# Patient Record
Sex: Female | Born: 1965 | Race: Black or African American | Hispanic: No | Marital: Single | Smoking: Current every day smoker
Health system: Southern US, Community
[De-identification: ages and names within clinical notes are randomized; demographics above are authoritative.]

## PROBLEM LIST (undated history)

## (undated) DIAGNOSIS — F329 Major depressive disorder, single episode, unspecified: Secondary | ICD-10-CM

## (undated) DIAGNOSIS — D649 Anemia, unspecified: Secondary | ICD-10-CM

## (undated) DIAGNOSIS — F32A Depression, unspecified: Secondary | ICD-10-CM

## (undated) DIAGNOSIS — I10 Essential (primary) hypertension: Secondary | ICD-10-CM

## (undated) DIAGNOSIS — F419 Anxiety disorder, unspecified: Secondary | ICD-10-CM

## (undated) HISTORY — DX: Essential (primary) hypertension: I10

## (undated) HISTORY — PX: OTHER SURGICAL HISTORY: SHX169

## (undated) HISTORY — PX: TUBAL LIGATION: SHX77

---

## 2010-05-17 ENCOUNTER — Ambulatory Visit (HOSPITAL_COMMUNITY)
Admission: RE | Admit: 2010-05-17 | Discharge: 2010-05-17 | Payer: Self-pay | Source: Home / Self Care | Admitting: Family Medicine

## 2010-08-30 ENCOUNTER — Emergency Department (HOSPITAL_COMMUNITY)
Admission: EM | Admit: 2010-08-30 | Discharge: 2010-08-30 | Disposition: A | Discharge status: Home / Self Care | Payer: Self-pay | Attending: Emergency Medicine | Admitting: Emergency Medicine

## 2010-08-30 DIAGNOSIS — N390 Urinary tract infection, site not specified: Secondary | ICD-10-CM | POA: Insufficient documentation

## 2010-08-30 DIAGNOSIS — R109 Unspecified abdominal pain: Secondary | ICD-10-CM | POA: Insufficient documentation

## 2010-08-30 DIAGNOSIS — N898 Other specified noninflammatory disorders of vagina: Secondary | ICD-10-CM | POA: Insufficient documentation

## 2010-08-30 LAB — WET PREP, GENITAL: Trich, Wet Prep: NONE SEEN

## 2010-08-30 LAB — URINALYSIS, ROUTINE W REFLEX MICROSCOPIC
Ketones, ur: NEGATIVE mg/dL
Nitrite: NEGATIVE
Specific Gravity, Urine: >1.03 — ABNORMAL HIGH (ref 1.005–1.030)
Urobilinogen, UA: 0.2 mg/dL (ref 0.0–1.0)

## 2010-08-30 LAB — POCT PREGNANCY, URINE: Preg Test, Ur: NEGATIVE

## 2010-09-01 LAB — GC/CHLAMYDIA PROBE AMP, GENITAL
Chlamydia, DNA Probe: NEGATIVE
GC Probe Amp, Genital: NEGATIVE

## 2011-02-26 ENCOUNTER — Other Ambulatory Visit: Payer: Self-pay | Admitting: Obstetrics & Gynecology

## 2011-03-01 ENCOUNTER — Encounter (HOSPITAL_COMMUNITY): Admission: RE | Admit: 2011-03-01 | Payer: Self-pay | Source: Ambulatory Visit

## 2011-03-01 ENCOUNTER — Encounter (HOSPITAL_COMMUNITY): Payer: Self-pay

## 2011-03-01 ENCOUNTER — Encounter (HOSPITAL_COMMUNITY)
Admission: RE | Admit: 2011-03-01 | Discharge: 2011-03-01 | Disposition: A | Discharge status: Home / Self Care | Payer: Self-pay | Source: Ambulatory Visit | Attending: Obstetrics & Gynecology | Admitting: Obstetrics & Gynecology

## 2011-03-01 HISTORY — DX: Depression, unspecified: F32.A

## 2011-03-01 HISTORY — DX: Anxiety disorder, unspecified: F41.9

## 2011-03-01 HISTORY — DX: Anemia, unspecified: D64.9

## 2011-03-01 HISTORY — DX: Major depressive disorder, single episode, unspecified: F32.9

## 2011-03-01 LAB — URINALYSIS, ROUTINE W REFLEX MICROSCOPIC
Bilirubin Urine: NEGATIVE
Nitrite: NEGATIVE
Specific Gravity, Urine: >1.03 — ABNORMAL HIGH (ref 1.005–1.030)
pH: 5.5 (ref 5.0–8.0)

## 2011-03-01 LAB — HCG, QUANTITATIVE, PREGNANCY: hCG, Beta Chain, Quant, S: <5 m[IU]/mL (ref <5)

## 2011-03-01 LAB — CBC
MCHC: 33.3 g/dL (ref 30.0–36.0)
Platelets: 393 10*3/uL (ref 150–400)
RDW: 15.4 % (ref 11.5–15.5)

## 2011-03-01 LAB — COMPREHENSIVE METABOLIC PANEL
ALT: 9 U/L (ref 0–35)
Albumin: 4 g/dL (ref 3.5–5.2)
Alkaline Phosphatase: 98 U/L (ref 39–117)
BUN: 7 mg/dL (ref 6–23)
Potassium: 4.2 mEq/L (ref 3.5–5.1)
Sodium: 139 mEq/L (ref 135–145)
Total Protein: 8 g/dL (ref 6.0–8.3)

## 2011-03-01 LAB — SURGICAL PCR SCREEN: Staphylococcus aureus: NEGATIVE

## 2011-03-01 NOTE — Patient Instructions (Addendum)
19 Tracy Freeman  03/01/2011   Your procedure is scheduled on: 03-07-2011  Report to [facility] at  615  AM.  Call this number if you have problems the morning of surgery: [phone]   Remember:   Do not eat food:After Midnight.  Do not drink clear liquids: After Midnight.  Take these medicines the morning of surgery with A SIP OF WATER: none   Do not wear jewelry, make-up or nail polish.  Do not wear lotions, powders, or perfumes. You may wear deodorant.  Do not shave 48 hours prior to surgery.  Do not bring valuables to the hospital.  Contacts, dentures or bridgework may not be worn into surgery.  Leave suitcase in the car. After surgery it may be brought to your room.  For patients admitted to the hospital, checkout time is 11:00 AM the day of discharge.   Patients discharged the day of surgery will not be allowed to drive home.  Name and phone number of your driver: family  Special Instructions: CHG Shower Use Special Wash: 1/2 bottle night before surgery and 1/2 bottle morning of surgery.   Please read over the following fact sheets that you were given: Pain Booklet, Blood Transfusion Information, MRSA Information, Surgical Site Infection Prevention, Anesthesia Post-op Instructions and Care and Recovery After Surgery PATIENT INSTRUCTIONS POST-ANESTHESIA  IMMEDIATELY FOLLOWING SURGERY:  Do not drive or operate machinery for the first twenty four hours after surgery.  Do not make any important decisions for twenty four hours after surgery or while taking narcotic pain medications or sedatives.  If you develop intractable nausea and vomiting or a severe headache please notify your doctor immediately.  FOLLOW-UP:  Please make an appointment with your surgeon as instructed. You do not need to follow up with anesthesia unless specifically instructed to do so.  WOUND CARE INSTRUCTIONS (if applicable):  Keep a dry clean dressing on the anesthesia/puncture wound site if there is drainage.   Once the wound has quit draining you may leave it open to air.  Generally you should leave the bandage intact for twenty four hours unless there is drainage.  If the epidural site drains for more than 36-48 hours please call the anesthesia department.  QUESTIONS?:  Please feel free to call your physician or the hospital operator if you have any questions, and they will be happy to assist you.     [facility] Anesthesia Department [address] [place] [phone]

## 2011-03-02 ENCOUNTER — Other Ambulatory Visit (HOSPITAL_COMMUNITY): Payer: Self-pay

## 2011-03-06 NOTE — Pre-Procedure Instructions (Signed)
Patient is a Probation officer , signed refusal of blood consent

## 2011-03-07 ENCOUNTER — Ambulatory Visit (HOSPITAL_COMMUNITY)
Admission: RE | Admit: 2011-03-07 | Discharge: 2011-03-09 | Disposition: A | Discharge status: Home / Self Care | Payer: Self-pay | Source: Ambulatory Visit | Attending: Obstetrics & Gynecology | Admitting: Obstetrics & Gynecology

## 2011-03-07 ENCOUNTER — Other Ambulatory Visit: Payer: Self-pay | Admitting: Obstetrics & Gynecology

## 2011-03-07 ENCOUNTER — Inpatient Hospital Stay (HOSPITAL_COMMUNITY): Payer: Self-pay | Admitting: Anesthesiology

## 2011-03-07 ENCOUNTER — Encounter (HOSPITAL_COMMUNITY): Payer: Self-pay | Admitting: Anesthesiology

## 2011-03-07 ENCOUNTER — Encounter (HOSPITAL_COMMUNITY): Payer: Self-pay

## 2011-03-07 ENCOUNTER — Encounter (HOSPITAL_COMMUNITY)
Admission: RE | Disposition: A | Discharge status: Home / Self Care | Payer: Self-pay | Source: Ambulatory Visit | Attending: Obstetrics & Gynecology

## 2011-03-07 DIAGNOSIS — E119 Type 2 diabetes mellitus without complications: Secondary | ICD-10-CM | POA: Insufficient documentation

## 2011-03-07 DIAGNOSIS — N920 Excessive and frequent menstruation with regular cycle: Secondary | ICD-10-CM | POA: Insufficient documentation

## 2011-03-07 DIAGNOSIS — Z90711 Acquired absence of uterus with remaining cervical stump: Secondary | ICD-10-CM

## 2011-03-07 DIAGNOSIS — Z01812 Encounter for preprocedural laboratory examination: Secondary | ICD-10-CM | POA: Insufficient documentation

## 2011-03-07 DIAGNOSIS — D649 Anemia, unspecified: Secondary | ICD-10-CM | POA: Insufficient documentation

## 2011-03-07 DIAGNOSIS — K432 Incisional hernia without obstruction or gangrene: Secondary | ICD-10-CM | POA: Insufficient documentation

## 2011-03-07 DIAGNOSIS — D259 Leiomyoma of uterus, unspecified: Principal | ICD-10-CM | POA: Insufficient documentation

## 2011-03-07 DIAGNOSIS — N946 Dysmenorrhea, unspecified: Secondary | ICD-10-CM | POA: Insufficient documentation

## 2011-03-07 HISTORY — PX: INCISIONAL HERNIA REPAIR: SHX193

## 2011-03-07 HISTORY — PX: SUPRACERVICAL ABDOMINAL HYSTERECTOMY: SHX5393

## 2011-03-07 SURGERY — HYSTERECTOMY, SUPRACERVICAL, ABDOMINAL
Anesthesia: General | Site: Abdomen | Wound class: Clean

## 2011-03-07 MED ORDER — NEOSTIGMINE METHYLSULFATE 1 MG/ML IJ SOLN
INTRAMUSCULAR | Status: AC
  Filled 2011-03-07: qty 10

## 2011-03-07 MED ORDER — SIMETHICONE 80 MG PO CHEW: 80.0000 mg | CHEWABLE_TABLET | Freq: Four times a day (QID) | ORAL | Status: DC | PRN

## 2011-03-07 MED ORDER — CEFAZOLIN SODIUM 1-5 GM-% IV SOLN
INTRAVENOUS | Status: DC | PRN
  Administered 2011-03-07: 1 g via INTRAVENOUS

## 2011-03-07 MED ORDER — LIDOCAINE HCL (PF) 1 % IJ SOLN
INTRAMUSCULAR | Status: AC
  Filled 2011-03-07: qty 5

## 2011-03-07 MED ORDER — CEFAZOLIN SODIUM 1-5 GM-% IV SOLN: 1.0000 g | INTRAVENOUS | Status: DC

## 2011-03-07 MED ORDER — ONDANSETRON HCL 4 MG/2ML IJ SOLN
INTRAMUSCULAR | Status: DC | PRN
  Administered 2011-03-07: 4 mg via INTRAVENOUS

## 2011-03-07 MED ORDER — FENTANYL CITRATE 0.05 MG/ML IJ SOLN
INTRAMUSCULAR | Status: AC
  Administered 2011-03-07: 50 ug via INTRAVENOUS
  Filled 2011-03-07: qty 2

## 2011-03-07 MED ORDER — LACTATED RINGERS IV SOLN: INTRAVENOUS | Status: DC

## 2011-03-07 MED ORDER — FENTANYL CITRATE 0.05 MG/ML IJ SOLN
INTRAMUSCULAR | Status: AC
  Administered 2011-03-07: 50 ug via INTRAVENOUS
  Filled 2011-03-07: qty 5

## 2011-03-07 MED ORDER — LACTATED RINGERS IV SOLN
INTRAVENOUS | Status: DC | PRN
  Administered 2011-03-07 (×2): via INTRAVENOUS

## 2011-03-07 MED ORDER — MIDAZOLAM HCL 2 MG/2ML IJ SOLN
INTRAMUSCULAR | Status: AC
  Filled 2011-03-07: qty 2

## 2011-03-07 MED ORDER — IBUPROFEN 800 MG PO TABS
800.0000 mg | ORAL_TABLET | Freq: Three times a day (TID) | ORAL | Status: DC | PRN
  Administered 2011-03-09: 800 mg via ORAL
  Filled 2011-03-07: qty 1

## 2011-03-07 MED ORDER — ONDANSETRON HCL 4 MG/2ML IJ SOLN: 4.0000 mg | Freq: Once | INTRAMUSCULAR | Status: DC | PRN

## 2011-03-07 MED ORDER — THROMBIN 5000 UNITS EX KIT: PACK | CUTANEOUS | Status: DC | PRN

## 2011-03-07 MED ORDER — DOCUSATE SODIUM 100 MG PO CAPS
100.0000 mg | ORAL_CAPSULE | Freq: Every day | ORAL | Status: DC
  Administered 2011-03-08: 100 mg via ORAL
  Filled 2011-03-07: qty 1

## 2011-03-07 MED ORDER — ROCURONIUM BROMIDE 100 MG/10ML IV SOLN
INTRAVENOUS | Status: DC | PRN
  Administered 2011-03-07: 10 mg via INTRAVENOUS
  Administered 2011-03-07: 30 mg via INTRAVENOUS
  Administered 2011-03-07: 5 mg via INTRAVENOUS
  Administered 2011-03-07: 15 mg via INTRAVENOUS
  Administered 2011-03-07: 10 mg via INTRAVENOUS

## 2011-03-07 MED ORDER — FENTANYL CITRATE 0.05 MG/ML IJ SOLN
INTRAMUSCULAR | Status: DC | PRN
  Administered 2011-03-07 (×8): 50 ug via INTRAVENOUS

## 2011-03-07 MED ORDER — HYDROMORPHONE HCL 1 MG/ML IJ SOLN
1.0000 mg | INTRAMUSCULAR | Status: DC | PRN
  Administered 2011-03-07 – 2011-03-09 (×7): 1 mg via INTRAVENOUS
  Filled 2011-03-07 (×8): qty 1

## 2011-03-07 MED ORDER — CEFAZOLIN SODIUM 1-5 GM-% IV SOLN
INTRAVENOUS | Status: AC
  Filled 2011-03-07: qty 50

## 2011-03-07 MED ORDER — LACTATED RINGERS IV SOLN
INTRAVENOUS | Status: DC
  Administered 2011-03-07: 500 mL via INTRAVENOUS
  Administered 2011-03-07: 07:00:00 via INTRAVENOUS

## 2011-03-07 MED ORDER — ONDANSETRON HCL 4 MG/2ML IJ SOLN
4.0000 mg | Freq: Four times a day (QID) | INTRAMUSCULAR | Status: DC | PRN
  Filled 2011-03-07: qty 2

## 2011-03-07 MED ORDER — ONDANSETRON HCL 4 MG PO TABS
4.0000 mg | ORAL_TABLET | Freq: Four times a day (QID) | ORAL | Status: DC | PRN
  Administered 2011-03-07: 4 mg via ORAL
  Filled 2011-03-07: qty 1

## 2011-03-07 MED ORDER — ONDANSETRON HCL 4 MG/2ML IJ SOLN
INTRAMUSCULAR | Status: AC
  Administered 2011-03-07: 4 mg
  Filled 2011-03-07: qty 2

## 2011-03-07 MED ORDER — GLYCOPYRROLATE 0.2 MG/ML IJ SOLN
INTRAMUSCULAR | Status: AC
  Filled 2011-03-07: qty 2

## 2011-03-07 MED ORDER — KCL IN DEXTROSE-NACL 20-5-0.45 MEQ/L-%-% IV SOLN
INTRAVENOUS | Status: DC
  Administered 2011-03-07 (×2): via INTRAVENOUS
  Administered 2011-03-08: 1000 mL via INTRAVENOUS
  Administered 2011-03-08: 09:00:00 via INTRAVENOUS

## 2011-03-07 MED ORDER — ROCURONIUM BROMIDE 50 MG/5ML IV SOLN
INTRAVENOUS | Status: AC
  Filled 2011-03-07: qty 1

## 2011-03-07 MED ORDER — HYDROCODONE-ACETAMINOPHEN 5-325 MG PO TABS
1.0000 | ORAL_TABLET | ORAL | Status: DC | PRN
  Administered 2011-03-07: 2 via ORAL
  Administered 2011-03-08: 1 via ORAL
  Filled 2011-03-07: qty 2
  Filled 2011-03-07: qty 1

## 2011-03-07 MED ORDER — LIDOCAINE HCL 1 % IJ SOLN
INTRAMUSCULAR | Status: DC | PRN
  Administered 2011-03-07: 30 mg via INTRADERMAL

## 2011-03-07 MED ORDER — NEOSTIGMINE METHYLSULFATE 1 MG/ML IJ SOLN
INTRAMUSCULAR | Status: DC | PRN
  Administered 2011-03-07: 3 mg via INTRAMUSCULAR

## 2011-03-07 MED ORDER — PROPOFOL 10 MG/ML IV EMUL
INTRAVENOUS | Status: DC | PRN
  Administered 2011-03-07: 120 mg via INTRAVENOUS

## 2011-03-07 MED ORDER — FENTANYL CITRATE 0.05 MG/ML IJ SOLN
25.0000 ug | INTRAMUSCULAR | Status: DC | PRN
  Administered 2011-03-07 (×4): 50 ug via INTRAVENOUS

## 2011-03-07 MED ORDER — METOPROLOL TARTRATE 1 MG/ML IV SOLN
INTRAVENOUS | Status: DC | PRN
  Administered 2011-03-07: 1 mg via INTRAVENOUS

## 2011-03-07 MED ORDER — MIDAZOLAM HCL 2 MG/2ML IJ SOLN
1.0000 mg | INTRAMUSCULAR | Status: DC | PRN
  Administered 2011-03-07: 2 mg via INTRAVENOUS

## 2011-03-07 MED ORDER — THROMBIN 5000 UNITS EX SOLR
CUTANEOUS | Status: AC
  Filled 2011-03-07: qty 5000

## 2011-03-07 MED ORDER — HEMOSTATIC AGENTS (NO CHARGE) OPTIME
TOPICAL | Status: DC | PRN
  Administered 2011-03-07: 1 via TOPICAL

## 2011-03-07 MED ORDER — GLYCOPYRROLATE 0.2 MG/ML IJ SOLN
INTRAMUSCULAR | Status: DC | PRN
  Administered 2011-03-07: .6 mg via INTRAVENOUS

## 2011-03-07 MED ORDER — SODIUM CHLORIDE 0.9 % IR SOLN
Status: DC | PRN
  Administered 2011-03-07: 2000 mL

## 2011-03-07 MED ORDER — ZOLPIDEM TARTRATE 5 MG PO TABS: 5.0000 mg | ORAL_TABLET | Freq: Every evening | ORAL | Status: DC | PRN

## 2011-03-07 MED ORDER — PROPOFOL 10 MG/ML IV EMUL
INTRAVENOUS | Status: AC
  Filled 2011-03-07: qty 20

## 2011-03-07 SURGICAL SUPPLY — 60 items
ADH SKN CLS APL DERMABOND .7 (GAUZE/BANDAGES/DRESSINGS)
APPLIER CLIP 13 LRG OPEN (CLIP) ×2
APR CLP LRG 13 20 CLIP (CLIP) ×1
BAG HAMPER (MISCELLANEOUS) ×2 IMPLANT
BRR ADH 6X5 SEPRAFILM 1 SHT (MISCELLANEOUS)
CELLS DAT CNTRL 66122 CELL SVR (MISCELLANEOUS) ×1 IMPLANT
CLIP APPLIE 13 LRG OPEN (CLIP) IMPLANT
CLOTH BEACON ORANGE TIMEOUT ST (SAFETY) ×2 IMPLANT
COVER LIGHT HANDLE STERIS (MISCELLANEOUS) ×4 IMPLANT
DERMABOND ADVANCED (GAUZE/BANDAGES/DRESSINGS)
DERMABOND ADVANCED .7 DNX12 (GAUZE/BANDAGES/DRESSINGS) ×1 IMPLANT
DRAPE WARM FLUID 44X44 (DRAPE) ×2 IMPLANT
DRESSING TELFA 8X3 (GAUZE/BANDAGES/DRESSINGS) ×1 IMPLANT
ELECT REM PT RETURN 9FT ADLT (ELECTROSURGICAL) ×2
ELECTRODE REM PT RTRN 9FT ADLT (ELECTROSURGICAL) ×1 IMPLANT
FORMALIN 10 PREFIL 480ML (MISCELLANEOUS) ×1 IMPLANT
GLOVE BIO SURGEON STRL SZ7.5 (GLOVE) ×1 IMPLANT
GLOVE BIOGEL PI IND STRL 8 (GLOVE) ×1 IMPLANT
GLOVE BIOGEL PI INDICATOR 8 (GLOVE) ×1
GLOVE ECLIPSE 6.5 STRL STRAW (GLOVE) ×1 IMPLANT
GLOVE ECLIPSE 7.0 STRL STRAW (GLOVE) ×1 IMPLANT
GLOVE ECLIPSE 8.0 STRL XLNG CF (GLOVE) ×2 IMPLANT
GLOVE EXAM NITRILE MD LF STRL (GLOVE) ×1 IMPLANT
GLOVE INDICATOR 7.0 STRL GRN (GLOVE) ×1 IMPLANT
GLOVE INDICATOR 7.5 STRL GRN (GLOVE) ×1 IMPLANT
GOWN BRE IMP SLV AUR XL STRL (GOWN DISPOSABLE) ×6 IMPLANT
GOWN SRG XL XLNG 56XLVL 4 (GOWN DISPOSABLE) ×1 IMPLANT
GOWN STRL NON-REIN XL XLG LVL4 (GOWN DISPOSABLE) ×2
HEMOSTAT SURGICEL 4X8 (HEMOSTASIS) ×1 IMPLANT
INST SET MAJOR GENERAL (KITS) ×2 IMPLANT
KIT ROOM TURNOVER APOR (KITS) ×2 IMPLANT
MANIFOLD NEPTUNE II (INSTRUMENTS) ×2 IMPLANT
MESH HERNIA 3X6 (Mesh General) ×1 IMPLANT
NDL HYPO 18GX1.5 BLUNT FILL (NEEDLE) IMPLANT
NEEDLE HYPO 18GX1.5 BLUNT FILL (NEEDLE) ×2 IMPLANT
NS IRRIG 1000ML POUR BTL (IV SOLUTION) ×4 IMPLANT
PACK ABDOMINAL MAJOR (CUSTOM PROCEDURE TRAY) ×2 IMPLANT
PAD ARMBOARD 7.5X6 YLW CONV (MISCELLANEOUS) ×2 IMPLANT
RETRACTOR WND ALEXIS 18 MED (MISCELLANEOUS) IMPLANT
RETRACTOR WND ALEXIS 25 LRG (MISCELLANEOUS) IMPLANT
RTRCTR WOUND ALEXIS 18CM MED (MISCELLANEOUS) ×2
RTRCTR WOUND ALEXIS 25CM LRG (MISCELLANEOUS)
SEPRAFILM MEMBRANE 5X6 (MISCELLANEOUS) ×1 IMPLANT
SET BASIN LINEN APH (SET/KITS/TRAYS/PACK) ×2 IMPLANT
SPONGE GAUZE 4X4 12PLY (GAUZE/BANDAGES/DRESSINGS) ×1 IMPLANT
STAPLER VISISTAT 35W (STAPLE) ×1 IMPLANT
SUT CHROMIC 0 CT 1 (SUTURE) ×3 IMPLANT
SUT MON AB 3-0 SH 27 (SUTURE) IMPLANT
SUT PDS AB 0 CTX 60 (SUTURE) ×1 IMPLANT
SUT PLAIN 2 0 XLH (SUTURE) ×1 IMPLANT
SUT PROLENE 2 0 CR (SUTURE) ×2 IMPLANT
SUT VIC AB 0 CT1 27 (SUTURE) ×8
SUT VIC AB 0 CT1 27XBRD ANTBC (SUTURE) ×1 IMPLANT
SUT VIC AB 0 CT1 27XCR 8 STRN (SUTURE) ×2 IMPLANT
SUT VIC AB 0 CTX 36 (SUTURE) ×4
SUT VIC AB 0 CTX36XBRD ANTBCTR (SUTURE) ×1 IMPLANT
SUT VICRYL 3 0 (SUTURE) IMPLANT
TAPE CLOTH SURG 4X10 WHT LF (GAUZE/BANDAGES/DRESSINGS) ×1 IMPLANT
TOWEL BLUE STERILE X RAY DET (MISCELLANEOUS) ×1 IMPLANT
TRAY FOLEY CATH 14FR (SET/KITS/TRAYS/PACK) ×2 IMPLANT

## 2011-03-07 NOTE — Anesthesia Preprocedure Evaluation (Addendum)
Anesthesia Evaluation  Name, MR# and DOB Patient awake  General Assessment Comment  Reviewed: Allergy & Precautions, H&P , NPO status , Patient's Chart, lab work & pertinent test results  History of Anesthesia Complications Negative for: history of anesthetic complications  Airway Mallampati: II  Neck ROM: Full  Mouth opening: Limited Mouth Opening  Dental  (+) Poor Dentition, Missing, Loose, Chipped and Dental Advisory Given   Pulmonary  Pneumonia: smokes 1/2 PPD.    pulmonary exam normalPulmonary Exam Normal     Cardiovascular Regular Normal    Neuro/Psych    (+) Anxiety, Depression,    GI/Hepatic/Renal   Endo/Other    Abdominal   Musculoskeletal   Hematology   Peds  Reproductive/Obstetrics    Anesthesia Other Findings            Anesthesia Physical Anesthesia Plan  ASA: II  Anesthesia Plan: General   Post-op Pain Management:    Induction: Intravenous  Airway Management Planned: Oral ETT  Additional Equipment:   Intra-op Plan:   Post-operative Plan: Extubation in OR  Informed Consent: I have reviewed the patients History and Physical, chart, labs and discussed the procedure including the risks, benefits and alternatives for the proposed anesthesia with the patient or authorized representative who has indicated his/her understanding and acceptance.     Plan Discussed with:   Anesthesia Plan Comments:         Anesthesia Quick Evaluation

## 2011-03-07 NOTE — Anesthesia Postprocedure Evaluation (Signed)
  Anesthesia Post-op Note  Patient: Tracy Freeman  Procedure(s) Performed:  HYSTERECTOMY SUPRACERVICAL ABDOMINAL; HERNIA REPAIR INCISIONAL - Incisional Herniorraphy with Mesh  Patient Location: PACU  Anesthesia Type: General  Level of Consciousness: awake, alert  and oriented  Airway and Oxygen Therapy: Patient Spontanous Breathing  Post-op Pain: none  Post-op Assessment: Post-op Vital signs reviewed, Patient's Cardiovascular Status Stable and Respiratory Function Stable  Post-op Vital Signs: stable  Complications: No apparent anesthesia complications

## 2011-03-07 NOTE — Transfer of Care (Signed)
Immediate Anesthesia Transfer of Care Note  Patient: Tracy Freeman  Procedure(s) Performed:  HYSTERECTOMY SUPRACERVICAL ABDOMINAL; HERNIA REPAIR INCISIONAL - Incisional Herniorraphy with Mesh  Patient Location: PACU  Anesthesia Type: General  Level of Consciousness: awake, alert  and oriented  Airway & Oxygen Therapy: Patient Spontanous Breathing and Patient connected to face mask oxygen  Post-op Assessment: Report given to PACU RN  Post vital signs: stable  Complications: No apparent anesthesia complications

## 2011-03-07 NOTE — Op Note (Signed)
Patient:  Tracy Freeman  DOB:  03-21-1966  MRN:  [id number]   Preop Diagnosis:  Incisional hernia  Postop Diagnosis:  Same  Procedure:  Incisional herniorrhaphy with mesh  Surgeon:  Franky Macho, M.D.  Asst.: Dr. Duane Lope, MD  Anes:  Gen. endotracheal  Indications:  Patient is a 33 her old black female who was undergoing a supracervical hysterectomy by Dr. Despina Hidden electively. He requested an intraoperative consultation and as he was having difficulty closing the fascia from previous C-section. Apparently, she did have a significant defect in the fascia when entering the abdominal cavity at the beginning of the procedure. On consultation, I found that there would be too much tension to bring the fascia back transversely without a bridging mesh repair. Thus it was elected to proceed with an incisional herniorrhaphy with mesh.  Procedure note:  Patient was already intubated in the supine position. The Pfannenstiel incision was opened. The peritoneal cavity was already closed. A 3 x 6" polypropylene mesh was then sized and secured in an ovoid fashion to the fascia using 2-0 Prolene interrupted sutures. A tension-free repair was found. The subcutaneous layer was then closed using 2-0 Vicryl interrupted sutures. The skin was closed using staples.  Dr. Despina Hidden will dictate his portion of the procedure.  Complications:  None  EBL:  Minimal  Specimen:  None

## 2011-03-07 NOTE — Anesthesia Procedure Notes (Addendum)
Date/Time: 03/07/2011 8:04 AM Performed by: Glynn Octave Preoxygenation: Pre-oxygenation with 100% oxygen    Performed by: Glynn Octave    Procedure Name: Intubation Date/Time: 03/07/2011 8:09 AM Performed by: Glynn Octave Pre-anesthesia Checklist: Patient identified, Patient being monitored, Timeout performed, Emergency Drugs available and Suction available Patient Re-evaluated:Patient Re-evaluated prior to inductionOxygen Delivery Method: Circle System Utilized Preoxygenation: Pre-oxygenation with 100% oxygen Intubation Type: IV induction Ventilation: Mask ventilation without difficulty Laryngoscope Size: Mac and 3 Grade View: Grade I Tube type: Oral Tube size: 7.0 mm Number of attempts: 1 Airway Equipment and Method: stylet Placement Confirmation: ETT inserted through vocal cords under direct vision,  positive ETCO2 and breath sounds checked- equal and bilateral Secured at: 21 cm Tube secured with: Tape Dental Injury: Teeth and Oropharynx as per pre-operative assessment

## 2011-03-07 NOTE — Op Note (Signed)
Preoperative diagnosis:  1.  Enlarged fibroid uterus, 18 weeks size                                          2.  Menometrorrhagia, resolved on megace                                          3.  Dysmenorrhea and pelvic pain                                         4.  Anemia, resolved on Megace and iron therapy  Postoperative diagnosis:  Same as above + incisional hernia  Procedure:  Abdominal hysterectomy, supracervical by Dr. Despina Hidden                     Repair of incisional hernia using mesh by Dr. Lovell Sheehan  Surgeon:  Lazaro Arms  MD                  Franky Macho, MD   Anesthesia:  General endotracheal  Preoperative clinical summary:  See HPI  Intraoperative findings: preoperative findings confirmed, both ovaries were normal and uterus was probably 20 weeks size.  Patient did have a previously undiagnosed incisional hernia which could not be repaired conventionally because of the resulting tension.  Dr. Lovell Sheehan placed physiomesh and repair.  Description of operation:  Patient was taken to the operating room and placed in the supine position where she underwent general endotracheal anesthesia.  She was then prepped and draped in the usual sterile fashion and a Foley catheter was placed for continuous bladder drainage.  A Pfannenstiel skin incision was made and carried down sharply to the rectus fascia which was scored in the midline and extended laterally.  The fascia was widely seperated I would suppose as a result of her previous caesarean sections.  The fascia was then taken off the muscles superiorly and inferiorly without difficulty.  The muscles were divided.  The peritoneal cavity was entered.  An medium Alexis self-retaining retractor was placed.  The upper abdomen was packed away. Both uterine cornu were grasped with Coker clamps.  The left round ligament was suture ligated and coagulated with the electrocautery unit.  The left vesicouterine serosal flap was created.  An avascular window  in in the peritoneum was created and the utero-ovarian ligament was cross clamped, cut and suture ligated.  The right round ligament was suture ligated and cut with the electrocautery unit.  The vesicouterine serosal flap on the right was created.  An avascular window in the peritoneum was created and the right utero-ovarian ligament was cross clamped, cut and double suture ligated.  Thus both ovaries were preserved.  The uterine vessels were skeletonized bilaterally.  The uterine vessels were clamped bilaterally,  then cut and suture ligated.  Two more pedicles were taken down the cervix medial to the uterine vessels.  Each pedicle was clamped cut and suture ligated with good resulting hemostasis.  As per the preoperative plan the cervix was then transected sharply and the specimen was removed.  The cervical stump was then closed anterior to posterior for hemostasis and reduce postoperative adhesions.  The pelvis  was irrigated vigorously and all pedicles were examined and found to be hemostatic.    All specimens were sent to pathology for routine evaluation.  The Alexis self-retaining retractor was removed and the pelvis was irrigated vigorously.  All packs were removed and all counts were correct at this point x 3.  The clear count system was employed and no other materials were found. The muscles and peritoneum were reapproximated loosely.  The fascia was closed by Dr. Lovell Sheehan using the mesh.  Please see his operative note for details.    The subcutaneous tissue was reapproximated using 2-0 vicryl.  The skin was closed staples.  Pressure dressing was placed and an abdominal binder will be placed post-operatively..  The patient was awakened from anesthesia taken to the recovery room in good stable condition. All sponge instrument and needle counts were correct x 3.  The patient received Ancef  prophylactically preoperatively.  Estimated blood loss for the procedure was 200  cc.  EURE,LUTHER H 01/31/2011 10:04  AM

## 2011-03-07 NOTE — H&P (Signed)
Preoperative History and Physical  Tracy Freeman is a 45 y.o. G 3 P 2 A1 with No LMP recorded.  She is on chronic Megestrol therapy.  Admitted for a abdominal approach supracervical hysterectomy for 18 week enlarged fibroid uterus.  Sonogram confirms the presence of multiple fibroids in varying locations.  The ovaries are normal.  Patient wants to conserve ovaries if they are normal.  When  we initially saw her in office she was bleeding heavily with hemoglobin of 8.3, now 12.7 with Megace and iron over the past 9 months.  Patient tried to avoid surgery but her pain has worsened.  We will do supracervical because she is a Jehovah's witness, to minimize blood loss, and to preserve support structure of pelvis, cervix vagina.   PMH:    Past Medical History  Diagnosis Date  . Anemia   . Anxiety   . Depression     PSH:     Past Surgical History  Procedure Date  . Cesarean section 83 and 88-mchs and mmh    x2  . Tubal ligation   . Excision of abdominal tumor     early 80's    SH:   History  Substance Use Topics  . Smoking status: Current Everyday Smoker -- 0.2 packs/day for 10 years    Types: Cigarettes  . Smokeless tobacco: Not on file  . Alcohol Use: 3.6 oz/week    6 Cans of beer per week    FH:    Family History  Problem Relation Age of Onset  . Anesthesia problems Neg Hx   . Hypotension Neg Hx   . Malignant hyperthermia Neg Hx   . Pseudochol deficiency Neg Hx      Allergies:  Allergies  Allergen Reactions  . Penicillins Other (See Comments)    Yeast infection    Medications:       No current facility-administered medications on file prior to encounter.   No current outpatient prescriptions on file prior to encounter.    Review of Systems: - Negative except per HPI  PHYSICAL EXAM:  Blood pressure 120/86, temperature 98.3 F (36.8 C), temperature source Oral, resp. rate 28, SpO2 100.00%.  General: General appearance - alert, well appearing, and in no  distress Mental status - alert, oriented to person, place, and time Chest - clear to auscultation, no wheezes, rales or rhonchi, symmetric air entry Heart - normal rate, regular rhythm, normal S1, S2, no murmurs, rubs, clicks or gallops Abdomen - soft, nontender, nondistended, no masses or organomegaly Breasts - breasts appear normal, no suspicious masses, no skin or nipple changes or axillary nodes Pelvic - VULVA: normal appearing vulva with no masses, tenderness or lesions, VAGINA: normal appearing vagina with normal color and discharge, no lesions, CERVIX: normal appearing cervix without discharge or lesions, UTERUS: enlarged to 18 week's size, ADNEXA: normal adnexa in size, nontender and no masses, RECTAL: normal rectal, no masses, PAP: recent Pap result reviewed with patient and normal 06/2011 Neurological - alert, oriented, normal speech, no focal findings or movement disorder noted Extremities - peripheral pulses normal, no pedal edema, no clubbing or cyanosis  Labs: Recent Results (from the past 336 hour(s))  CBC   Collection Time   03/01/11  1:17 PM      Component Value Range   WBC 7.6  4.0 - 10.5 (K/uL)   RBC 4.54  3.87 - 5.11 (MIL/uL)   Hemoglobin 12.7  12.0 - 15.0 (g/dL)   HCT 16.1  09.6 - 04.5 (%)  MCV 83.9  78.0 - 100.0 (fL)   MCH 28.0  26.0 - 34.0 (pg)   MCHC 33.3  30.0 - 36.0 (g/dL)   RDW 46.9  62.9 - 52.8 (%)   Platelets 393  150 - 400 (K/uL)  COMPREHENSIVE METABOLIC PANEL   Collection Time   03/01/11  1:17 PM      Component Value Range   Sodium 139  135 - 145 (mEq/L)   Potassium 4.2  3.5 - 5.1 (mEq/L)   Chloride 104  96 - 112 (mEq/L)   CO2 22  19 - 32 (mEq/L)   Glucose, Bld 88  70 - 99 (mg/dL)   BUN 7  6 - 23 (mg/dL)   Creatinine, Ser 4.13  0.50 - 1.10 (mg/dL)   Calcium 24.4  8.4 - 10.5 (mg/dL)   Total Protein 8.0  6.0 - 8.3 (g/dL)   Albumin 4.0  3.5 - 5.2 (g/dL)   AST 15  0 - 37 (U/L)   ALT 9  0 - 35 (U/L)   Alkaline Phosphatase 98  39 - 117 (U/L)   Total  Bilirubin 0.4  0.3 - 1.2 (mg/dL)   GFR calc non Af Amer >60  >60 (mL/min)   GFR calc Af Amer >60  >60 (mL/min)  HCG, QUANTITATIVE, PREGNANCY   Collection Time   03/01/11  1:17 PM      Component Value Range   hCG, Beta Chain, Quant, S <5  <5 (mIU/mL)  URINALYSIS, ROUTINE W REFLEX MICROSCOPIC   Collection Time   03/01/11  1:30 PM      Component Value Range   Color, Urine YELLOW  YELLOW    Appearance CLEAR  CLEAR    Specific Gravity, Urine >1.030 (*) 1.005 - 1.030    pH 5.5  5.0 - 8.0    Glucose, UA NEGATIVE  NEGATIVE (mg/dL)   Hgb urine dipstick NEGATIVE  NEGATIVE    Bilirubin Urine NEGATIVE  NEGATIVE    Ketones, ur NEGATIVE  NEGATIVE (mg/dL)   Protein, ur NEGATIVE  NEGATIVE (mg/dL)   Urobilinogen, UA 0.2  0.0 - 1.0 (mg/dL)   Nitrite NEGATIVE  NEGATIVE    Leukocytes, UA NEGATIVE  NEGATIVE   SURGICAL PCR SCREEN   Collection Time   03/01/11  1:30 PM      Component Value Range   MRSA, PCR NEGATIVE  NEGATIVE    Staphylococcus aureus NEGATIVE  NEGATIVE     EKG: No orders found for this or any previous visit.  Imaging Studies: No results found.    Assessment: 1.  Enlarged Fibroid uterus, 18 weeks size 2..  Anemia, resolved from Megace and iron therapy 3.  Increasing pain from fibroids  Plan: Abdominal supracervical hysterectomy.  Pt understands the risks of surgery including but not limited t  excessive bleeding requiring transfusion or reoperation, post-operative infection requiring prolonged hospitalization or re-hospitalization and antibiotic therapy, and damage to other organs including bladder, bowel, ureters and major vessels.  The patient also understands the alternative treatment options which were discussed in full.  All questions were answered.  EURE,LUTHER H 03/07/2011 7:43 AM

## 2011-03-08 LAB — BASIC METABOLIC PANEL
BUN: <3 mg/dL — ABNORMAL LOW (ref 6–23)
Calcium: 9.2 mg/dL (ref 8.4–10.5)
GFR calc Af Amer: >60 mL/min (ref >60)
GFR calc non Af Amer: >60 mL/min (ref >60)
Potassium: 3.6 mEq/L (ref 3.5–5.1)
Sodium: 136 mEq/L (ref 135–145)

## 2011-03-08 LAB — CBC
MCHC: 33.2 g/dL (ref 30.0–36.0)
RDW: 14.7 % (ref 11.5–15.5)

## 2011-03-08 MED ORDER — DIPHENHYDRAMINE HCL 25 MG PO CAPS
50.0000 mg | ORAL_CAPSULE | Freq: Four times a day (QID) | ORAL | Status: DC | PRN
  Administered 2011-03-08: 50 mg via ORAL
  Filled 2011-03-08: qty 2

## 2011-03-08 MED ORDER — THROMBIN 5000 UNITS EX KIT: PACK | CUTANEOUS | Status: DC | PRN

## 2011-03-08 MED ORDER — FUROSEMIDE 10 MG/ML IJ SOLN
20.0000 mg | Freq: Once | INTRAMUSCULAR | Status: AC
  Administered 2011-03-08: 20 mg via INTRAVENOUS
  Filled 2011-03-08: qty 2

## 2011-03-08 MED ORDER — OXYCODONE-ACETAMINOPHEN 5-325 MG PO TABS
1.0000 | ORAL_TABLET | ORAL | Status: DC | PRN
  Administered 2011-03-08: 1 via ORAL
  Administered 2011-03-08: 2 via ORAL
  Administered 2011-03-08: 1 via ORAL
  Administered 2011-03-09 (×3): 2 via ORAL
  Filled 2011-03-08: qty 1
  Filled 2011-03-08 (×2): qty 2
  Filled 2011-03-08: qty 1
  Filled 2011-03-08 (×2): qty 2

## 2011-03-08 MED ORDER — THROMBIN 5000 UNITS EX KIT
PACK | CUTANEOUS | Status: DC | PRN
  Administered 2011-03-07: 5000 [IU] via TOPICAL

## 2011-03-08 NOTE — Progress Notes (Signed)
1 Day Post-Op  Subjective: Moderate incisional pain. I introduced myself as a Development worker, international aid that helped closed her abdominal wall hernia.  Objective: Vital signs in last 24 hours: Temp:  [98.1 F (36.7 C)-99.1 F (37.3 C)] 98.6 F (37 C) (08/23 0458) Pulse Rate:  [67-85] 67  (08/23 0458) Resp:  [16-25] 16  (08/23 0458) BP: (111-141)/(63-94) 111/70 mmHg (08/23 0458) SpO2:  [93 %-100 %] 99 % (08/23 0458) Last BM Date: 03/05/11  Intake/Output from previous day: 08/22 0701 - 08/23 0700 In: 3622.1 [P.O.:120; I.V.:3502.1] Out: 450 [Urine:250; Blood:200] Intake/Output this shift:    Abdomen: Soft. Abdominal binder in place. Dressing dry and intact.  Lab Results:   [facility] 03/08/11 0604  WBC 6.3  HGB 11.2*  HCT 33.7*  PLT 311   BMET  Basename 03/08/11 0604  NA 136  K 3.6  CL 105  CO2 23  GLUCOSE 132*  BUN <3*  CREATININE 0.49*  CALCIUM 9.2   PT/INR No results found for this basename: LABPROT:2,INR:2 in the last 72 hours  Studies/Results: No results found.  Anti-infectives: Anti-infectives     Start     Dose/Rate Route Frequency Ordered Stop   03/07/11 0718   ceFAZolin (ANCEF) IVPB 1 g/50 mL premix  Status:  Discontinued        1 g 100 mL/hr over 30 Minutes Intravenous On call to O.R. 03/07/11 0718 03/07/11 1426          Assessment/Plan: s/p Procedure(s): HYSTERECTOMY SUPRACERVICAL ABDOMINAL HERNIA REPAIR INCISIONAL Impression: Patient is stable from my standpoint. Will defer postoperative management to Dr. Despina Hidden.  Please call me if I can be of further assistance.  LOS: 1 day    Holli Rengel A 03/08/2011

## 2011-03-08 NOTE — Anesthesia Postprocedure Evaluation (Signed)
  Anesthesia Post-op Note  Patient: Tracy Freeman  Procedure(s) Performed:  HYSTERECTOMY SUPRACERVICAL ABDOMINAL; HERNIA REPAIR INCISIONAL - Incisional Herniorraphy with Mesh  Patient Location: Room 334  Anesthesia Type: General  Level of Consciousness: awake, alert  and oriented  Airway and Oxygen Therapy:   Room air  Post-op Pain: 6 /10  Post-op Assessment: Post-op Vital signs reviewed, Patient's Cardiovascular Status Stable and Respiratory Function Stable  Post-op Vital Signs: Reviewed and stable  Complications: Patieint C/O surgical pain at site. Has been up to bathroom.

## 2011-03-08 NOTE — Progress Notes (Signed)
1 Day Post-Op Procedure(s): HYSTERECTOMY SUPRACERVICAL ABDOMINAL HERNIA REPAIR INCISIONAL  Subjective: Patient reports nausea, incisional pain, tolerating PO and no problems voiding.    Objective: I have reviewed patient's vital signs, intake and output and labs.  Blood pressure 111/80, pulse 80, temperature 98.9 F (37.2 C), temperature source Oral, resp. rate 16, SpO2 100.00%.  General: alert, cooperative and mild distress GI: soft, non-tender; bowel sounds normal; no masses,  no organomegaly and incision: clean, dry and intact Vaginal Bleeding: none Normal post operative pain  Results for orders placed during the hospital encounter of 03/07/11 (from the past 24 hour(s))  CBC     Status: Abnormal   Collection Time   03/08/11  6:04 AM      Component Value Range   WBC 6.3  4.0 - 10.5 (K/uL)   RBC 4.04  3.87 - 5.11 (MIL/uL)   Hemoglobin 11.2 (*) 12.0 - 15.0 (g/dL)   HCT 69.6 (*) 29.5 - 46.0 (%)   MCV 83.4  78.0 - 100.0 (fL)   MCH 27.7  26.0 - 34.0 (pg)   MCHC 33.2  30.0 - 36.0 (g/dL)   RDW 28.4  13.2 - 44.0 (%)   Platelets 311  150 - 400 (K/uL)  BASIC METABOLIC PANEL     Status: Abnormal   Collection Time   03/08/11  6:04 AM      Component Value Range   Sodium 136  135 - 145 (mEq/L)   Potassium 3.6  3.5 - 5.1 (mEq/L)   Chloride 105  96 - 112 (mEq/L)   CO2 23  19 - 32 (mEq/L)   Glucose, Bld 132 (*) 70 - 99 (mg/dL)   BUN <3 (*) 6 - 23 (mg/dL)   Creatinine, Ser 1.02 (*) 0.50 - 1.10 (mg/dL)   Calcium 9.2  8.4 - 72.5 (mg/dL)   GFR calc non Af Amer >60  >60 (mL/min)   GFR calc Af Amer >60  >60 (mL/min)     Assessment: s/p Procedure(s): HYSTERECTOMY SUPRACERVICAL ABDOMINAL HERNIA REPAIR INCISIONAL: stable and progressing well  Plan: Advance diet Encourage ambulation Anticipate discharge tomorrow, hernia repair adding to need for additional stay Routine post operative care  LOS: 1 day    EURE,LUTHER H 03/08/2011, 11:57 AM

## 2011-03-08 NOTE — Addendum Note (Signed)
Addendum  created 03/08/11 1108 by Marylene Buerger, CRNA   Modules edited:Anesthesia Events

## 2011-03-09 ENCOUNTER — Encounter (HOSPITAL_COMMUNITY): Payer: Self-pay | Admitting: Obstetrics & Gynecology

## 2011-03-09 MED ORDER — PROMETHAZINE HCL 12.5 MG PO TABS: 25.0000 mg | ORAL_TABLET | Freq: Four times a day (QID) | ORAL | Status: AC | PRN

## 2011-03-09 MED ORDER — IBUPROFEN 800 MG PO TABS: 800.0000 mg | ORAL_TABLET | Freq: Three times a day (TID) | ORAL | Status: AC | PRN

## 2011-03-09 MED ORDER — OXYCODONE-ACETAMINOPHEN 5-325 MG PO TABS: 1.0000 | ORAL_TABLET | ORAL | Status: AC | PRN

## 2011-03-09 NOTE — Progress Notes (Signed)
2 Days Post-Op Procedure(s): HYSTERECTOMY SUPRACERVICAL ABDOMINAL HERNIA REPAIR INCISIONAL  Subjective: Patient reports nausea.  Drinking and eating fine, ambulatory.  Pain well controlled on meds.  Objective: I have reviewed patient's vital signs, intake and output and medications.  General: alert, cooperative and no distress Cardio: regular rate and rhythm, S1, S2 normal, no murmur, click, rub or gallop GI: normal findings: bowel sounds normal and soft , normal post op tenderness, incision clean dry and intact Extremities: extremities normal, atraumatic, no cyanosis or edema Vaginal Bleeding: none  Assessment: s/p Procedure(s): HYSTERECTOMY SUPRACERVICAL ABDOMINAL HERNIA REPAIR INCISIONAL: stable, progressing well and tolerating diet  Plan: Discharge home see discharge summary  LOS: 2 days    Tracy Freeman H 03/09/2011, 9:01 AM

## 2011-03-09 NOTE — Discharge Summary (Signed)
Physician Discharge Summary  Patient ID: Tracy Freeman MRN: [id number] DOB/AGE: 46-Jun-1967 46 y.o.  Admit date: 03/07/2011 Discharge date: 03/09/2011  Admission Diagnoses: S/p supracervical hysterectomy and repair of incisional hernia  Discharge Diagnoses:  Normal post op course  Discharged Condition: stable  Hospital Course: see progess noted, unremarkable post operative course  Consults: general surgery, intraoperative, Dr Lovell Sheehan place a physiomesh due to incisional hernia found at time of surgery  Significant Diagnostic Studies: labs:  Results for orders placed during the hospital encounter of 03/07/11 (from the past 48 hour(s))  CBC     Status: Abnormal   Collection Time   03/08/11  6:04 AM      Component Value Range Comment   WBC 6.3  4.0 - 10.5 (K/uL)    RBC 4.04  3.87 - 5.11 (MIL/uL)    Hemoglobin 11.2 (*) 12.0 - 15.0 (g/dL)    HCT 08.6 (*) 57.8 - 46.0 (%)    MCV 83.4  78.0 - 100.0 (fL)    MCH 27.7  26.0 - 34.0 (pg)    MCHC 33.2  30.0 - 36.0 (g/dL)    RDW 46.9  62.9 - 52.8 (%)    Platelets 311  150 - 400 (K/uL)   BASIC METABOLIC PANEL     Status: Abnormal   Collection Time   03/08/11  6:04 AM      Component Value Range Comment   Sodium 136  135 - 145 (mEq/L)    Potassium 3.6  3.5 - 5.1 (mEq/L)    Chloride 105  96 - 112 (mEq/L)    CO2 23  19 - 32 (mEq/L)    Glucose, Bld 132 (*) 70 - 99 (mg/dL)    BUN <3 (*) 6 - 23 (mg/dL)    Creatinine, Ser 4.13 (*) 0.50 - 1.10 (mg/dL)    Calcium 9.2  8.4 - 10.5 (mg/dL)    GFR calc non Af Amer >60  >60 (mL/min)    GFR calc Af Amer >60  >60 (mL/min)     Treatments: post op care  Discharge Exam: Blood pressure 126/83, pulse 76, temperature 99 F (37.2 C), temperature source Oral, resp. rate 24, height 5\' 3"  (1.6 m), weight 123 lb (55.792 kg), SpO2 96.00%. See progress note  Disposition: Home or Self Care  Discharge Orders    Future Orders Please Complete By Expires   Diet general      Increase activity slowly      Driving Restrictions      Comments:   None til directed by Dr. Despina Hidden   Lifting restrictions      Comments:   8 pound maximum until further instructed by Dr Despina Hidden   Sexual Activity Restrictions      Comments:   none   Discharge wound care:      Comments:   Keep wound clean and dry   Call MD for:  temperature >100.4      Call MD for:  persistant nausea and vomiting      Call MD for:  severe uncontrolled pain      Call MD for:  redness, tenderness, or signs of infection (pain, swelling, redness, odor or green/yellow discharge around incision site)        Current Discharge Medication List    START taking these medications   Details  ibuprofen (ADVIL,MOTRIN) 800 MG tablet Take 1 tablet (800 mg total) by mouth every 8 (eight) hours as needed for pain (mild pain). Qty: 30 tablet, Refills: 0  oxyCODONE-acetaminophen (PERCOCET) 5-325 MG per tablet Take 1-2 tablets by mouth every 4 (four) hours as needed. Qty: 30 tablet, Refills: 0    promethazine (PHENERGAN) 12.5 MG tablet Take 2 tablets (25 mg total) by mouth every 6 (six) hours as needed for nausea. Qty: 12 tablet, Refills: 0      CONTINUE these medications which have NOT CHANGED   Details  Calcium Carbonate-Vitamin D (CALCIUM 600/VITAMIN D) 600-400 MG-UNIT per tablet Take 1 tablet by mouth 2 (two) times daily.      Omega-3 Fatty Acids (FISH OIL) 1200 MG CAPS Take 1 capsule by mouth daily.        STOP taking these medications     ferrous sulfate 325 (65 FE) MG tablet      megestrol (MEGACE) 40 MG tablet        Follow-up Information    Follow up with Lazaro Arms, MD in 5 days. (patient already has appointment scheduled)    Contact information:   [facility] [address] [place] [address] [phone]          Signed: Lazaro Arms 03/09/2011, 9:13 AM

## 2011-11-08 IMAGING — US US TRANSVAGINAL NON-OB
1 series · 14 of 25 positions shown · non-contrast
Comparison: None

CLINICAL DATA: Enlarged uterus, dysmenorrhea.

TRANSABDOMINAL AND TRANSVAGINAL ULTRASOUND OF PELVIS
TECHNIQUE: Both transabdominal and transvaginal ultrasound
examinations of the pelvis were performed including evaluation of
the uterus, ovaries, adnexal regions, and pelvic cul-de-sac.

[Series 1: us transvaginal non-ob · 0.26mm/px · 14 of 79 slices shown]
[im 1/79]
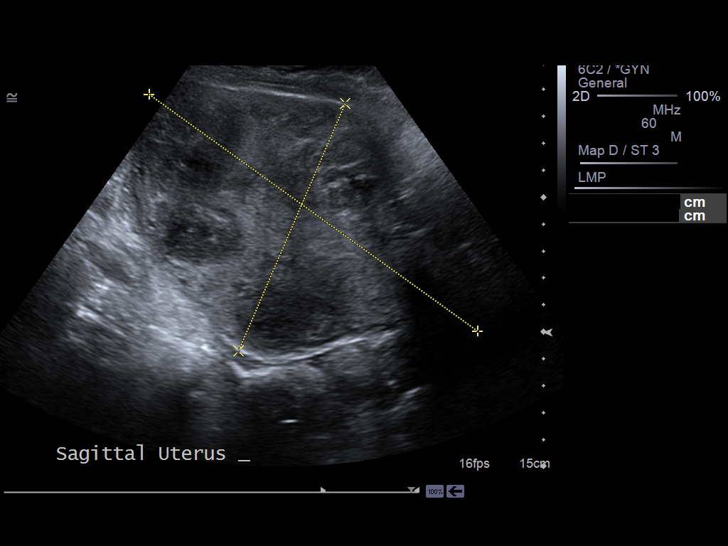
[im 7/79]
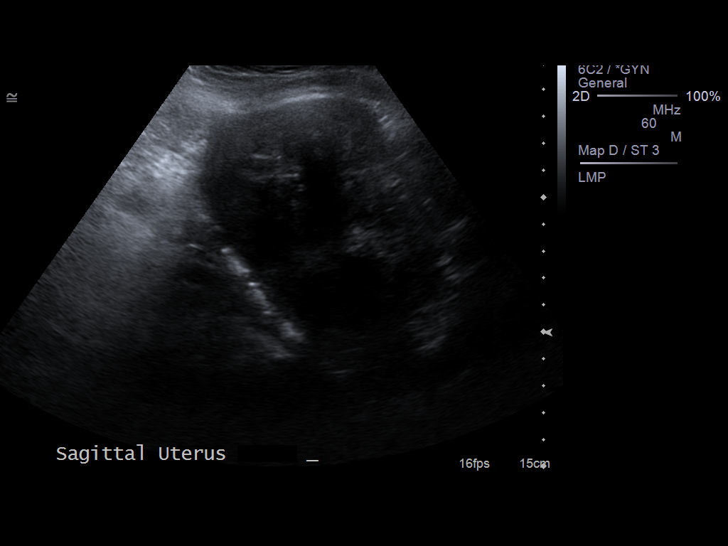
[im 14/79]
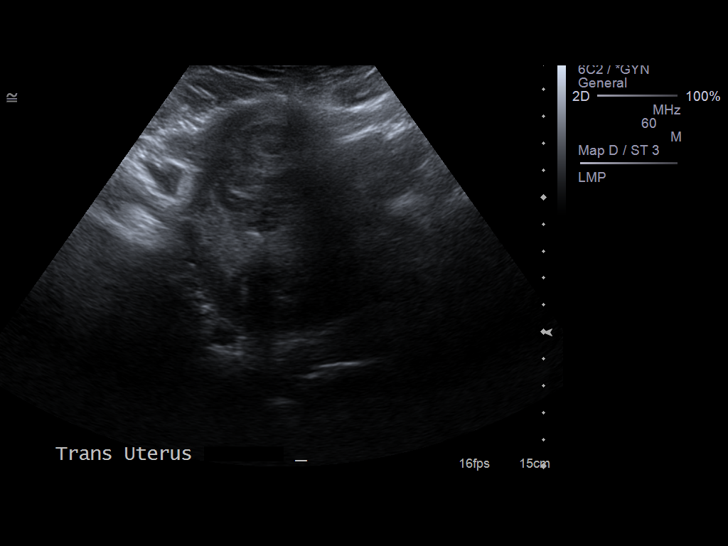
[im 20/79]
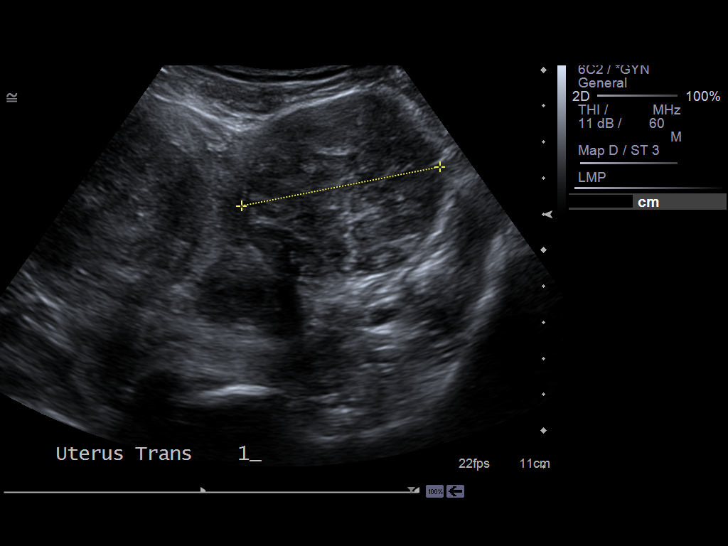
[im 27/79]
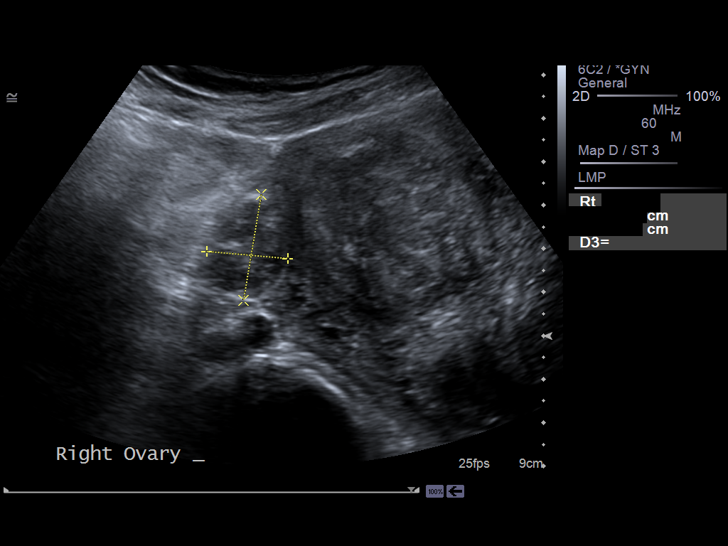
[im 30/79]
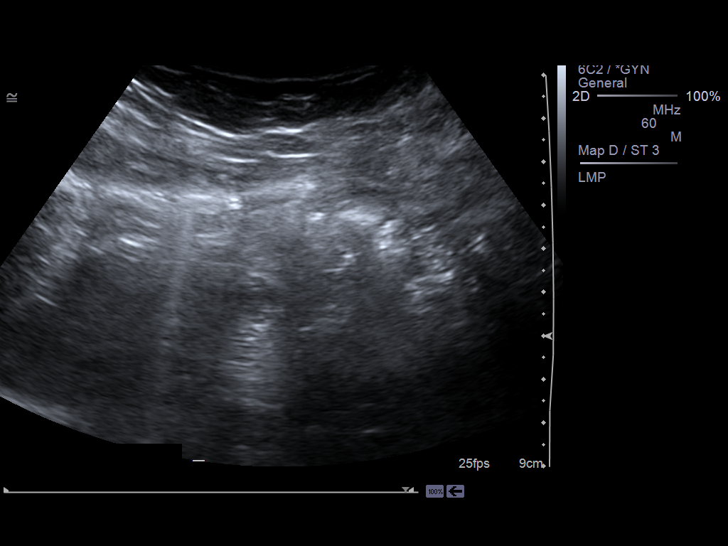
[im 36/79]
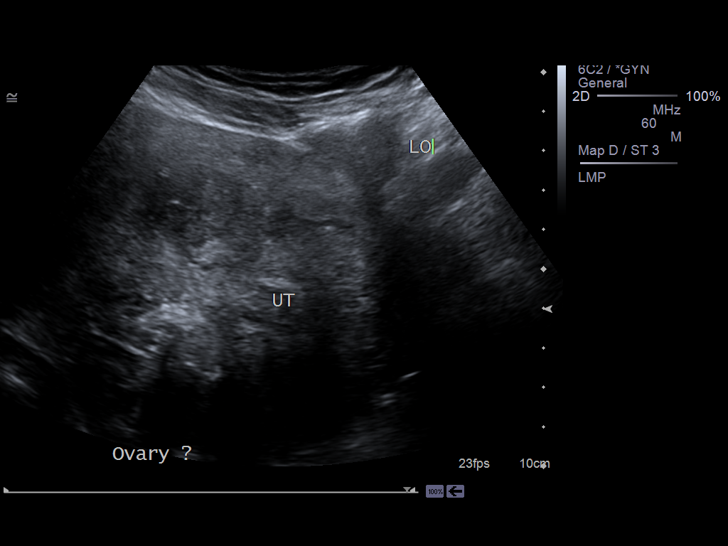
[im 43/79]
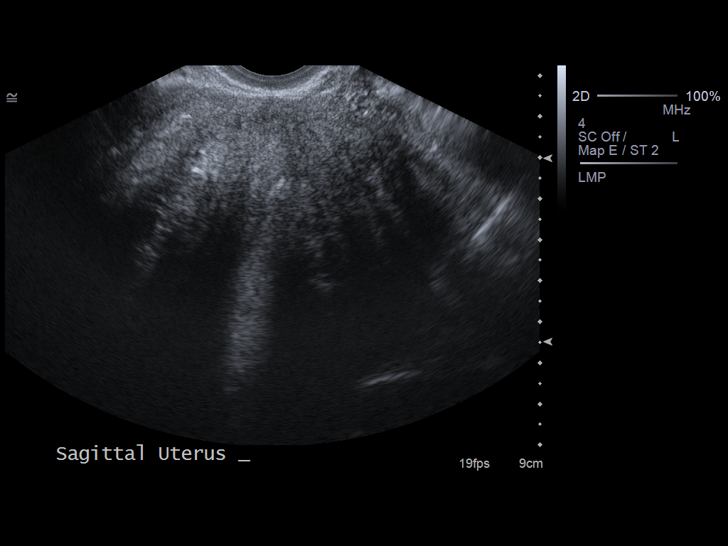
[im 49/79]
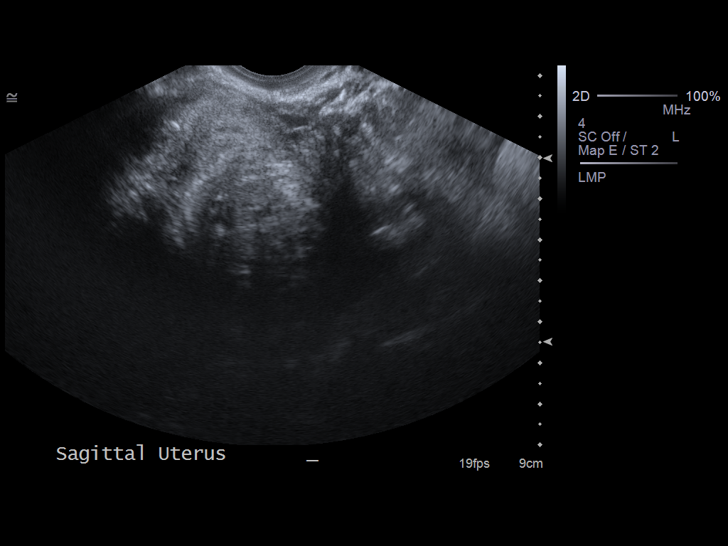
[im 53/79]
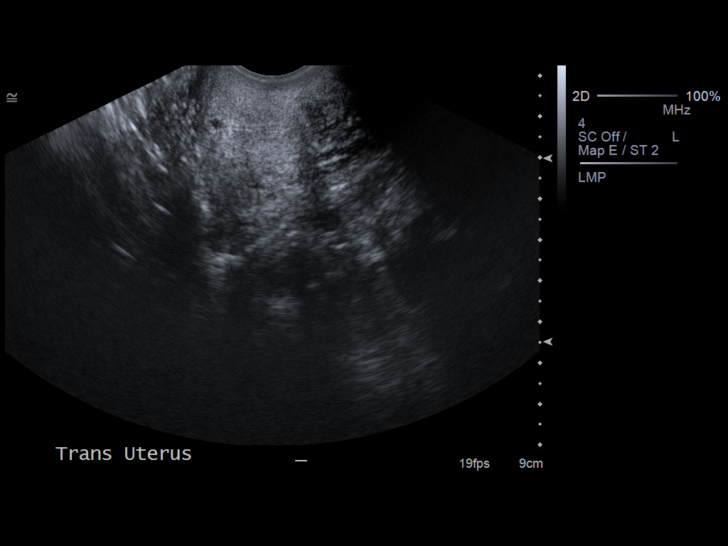
[im 59/79]
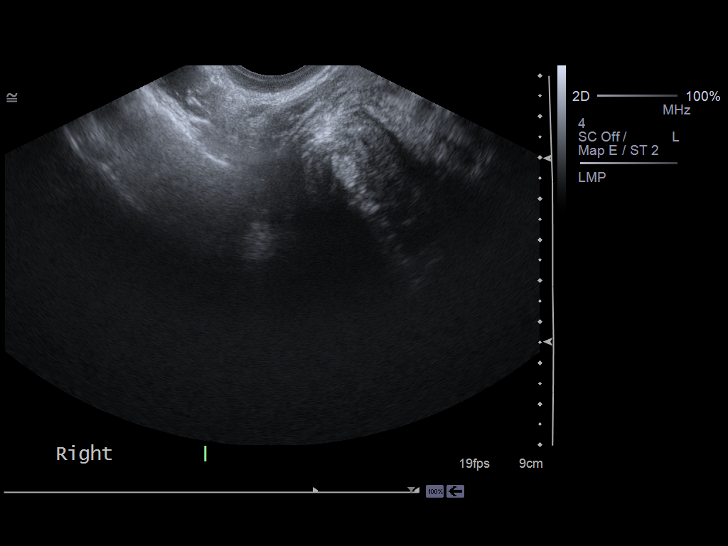
[im 66/79]
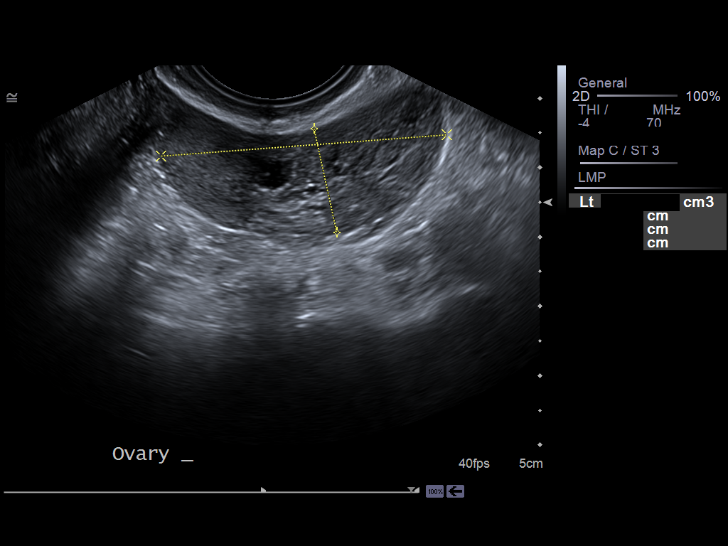
[im 72/79]
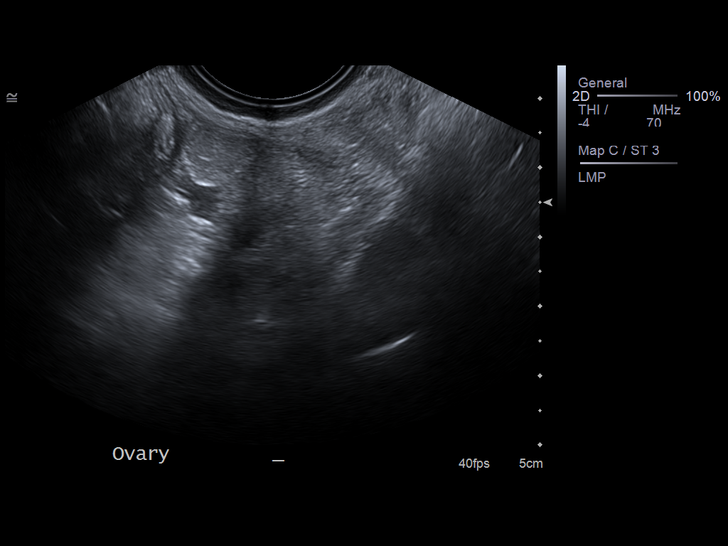
[im 79/79]
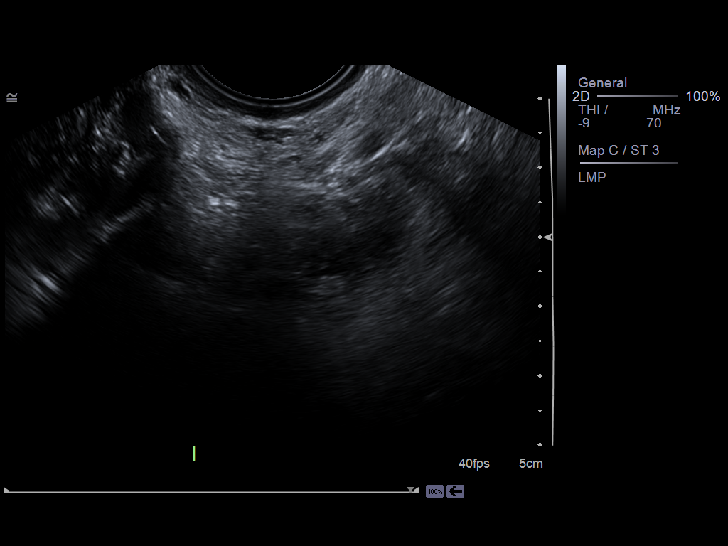

[14 of 25 positions shown; findings below may reference images not displayed]

FINDINGS: Uterus: Uterus enlarged, measuring 15.0 x 10.0 x 11.0 cm.  Numerous
fibroids throughout the uterus.  The largest is noted anteriorly
measuring 5.7 cm.  There is also a fibroid in the posterior fundus
measuring up to 5.0 cm.  Other multiple small fibroids present.

Endometrium: Not visualized due to distortion from numerous
fibroids.

Right Ovary: 1.9 x 2.5 x 2.1 cm. Normal size and echotexture.  No
adnexal masses.

Left Ovary: 2.6 x 4.1 x 1.5 cm. Normal size and echotexture.  No
adnexal masses.

Other Findings:  No free fluid.
IMPRESSION: Enlarged uterus with numerous fibroids as above.

## 2020-02-09 ENCOUNTER — Encounter: Payer: Self-pay | Admitting: Obstetrics and Gynecology

## 2020-03-14 ENCOUNTER — Encounter: Payer: Self-pay | Admitting: Obstetrics and Gynecology

## 2022-05-09 ENCOUNTER — Other Ambulatory Visit: Payer: Self-pay | Admitting: Nurse Practitioner

## 2022-05-09 DIAGNOSIS — Z1231 Encounter for screening mammogram for malignant neoplasm of breast: Secondary | ICD-10-CM

## 2022-05-17 ENCOUNTER — Inpatient Hospital Stay
Admission: RE | Admit: 2022-05-17 | Discharge: 2022-05-17 | Disposition: A | Discharge status: Home / Self Care | Payer: Self-pay | Source: Ambulatory Visit | Attending: Nurse Practitioner | Admitting: Nurse Practitioner

## 2022-05-17 ENCOUNTER — Other Ambulatory Visit: Payer: Self-pay | Admitting: Nurse Practitioner

## 2022-05-17 DIAGNOSIS — Z1231 Encounter for screening mammogram for malignant neoplasm of breast: Secondary | ICD-10-CM

## 2022-05-21 ENCOUNTER — Other Ambulatory Visit (HOSPITAL_COMMUNITY): Payer: Self-pay | Admitting: Obstetrics and Gynecology

## 2022-05-21 DIAGNOSIS — N6320 Unspecified lump in the left breast, unspecified quadrant: Secondary | ICD-10-CM

## 2022-05-24 ENCOUNTER — Encounter: Payer: Self-pay | Admitting: *Deleted

## 2022-05-25 ENCOUNTER — Ambulatory Visit (HOSPITAL_COMMUNITY): Payer: Self-pay

## 2022-06-01 ENCOUNTER — Inpatient Hospital Stay: Payer: Self-pay | Attending: Obstetrics and Gynecology | Admitting: Hematology and Oncology

## 2022-06-01 VITALS — BP 137/91 | Wt 140.8 lb

## 2022-06-01 DIAGNOSIS — Z1231 Encounter for screening mammogram for malignant neoplasm of breast: Secondary | ICD-10-CM

## 2022-06-01 NOTE — Progress Notes (Signed)
Tracy Freeman is a 56 y.o. female who presents to [facility] clinic today with complaint of left breast mass.    Pap Smear: Pap not smear completed today. Last Pap smear was 05/04/22 at [facility] clinic and was normal. Per patient has no history of an abnormal Pap smear. Last Pap smear result is not available in Epic.   Physical exam: Breasts Breasts symmetrical. No skin abnormalities bilateral breasts. No nipple retraction bilateral breasts. No nipple discharge bilateral breasts. No lymphadenopathy. No lumps palpated bilateral breasts.       Pelvic/Bimanual Pap is not indicated today    Smoking History: Patient has is a current smoker at 1/2 packs per day and was referred to quit line.    Patient Navigation: Patient education provided. Access to services provided for patient through [facility] program. No interpreter provided. No transportation provided   Colorectal Cancer Screening: Per patient has never had colonoscopy completed No complaints today. Has been referred to [place] for colonoscopy.   Breast and Cervical Cancer Risk Assessment: Patient has family history of breast cancer, maternal and paternal 1st cousins. Patient does not have history of cervical dysplasia, immunocompromised, or DES exposure in-utero.  Risk Assessment   No risk assessment data     A: [facility] exam without pap smear Complaint of left pea-sized mass in left upper, outer quadrant. Benign exam.   P: [facility] Appointment scheduled 06/05/22.  Tracy Ped, NP 06/01/2022 12:09 PM

## 2022-06-01 NOTE — Patient Instructions (Signed)
Taught Tracy Freeman about self breast awareness. Patient did not need a Pap smear today due to last Pap smear was in 2023 per patient. Let her know [facility] will cover Pap smears every 5 years unless has a history of abnormal Pap smears. Referred patient to the [facility] for diagnostic mammogram. Appointment scheduled for 06/05/22. Patient aware of appointment and will be there. Let patient know will follow up with her within the next couple weeks with results. Tracy Freeman verbalized understanding.  Melodye Ped, NP 12:37 PM

## 2022-06-05 ENCOUNTER — Ambulatory Visit (HOSPITAL_COMMUNITY)
Admission: RE | Admit: 2022-06-05 | Discharge: 2022-06-05 | Disposition: A | Discharge status: Home / Self Care | Payer: Self-pay | Source: Ambulatory Visit | Attending: Obstetrics and Gynecology | Admitting: Obstetrics and Gynecology

## 2022-06-05 DIAGNOSIS — N6320 Unspecified lump in the left breast, unspecified quadrant: Secondary | ICD-10-CM

## 2022-08-30 ENCOUNTER — Other Ambulatory Visit: Payer: Self-pay | Admitting: *Deleted

## 2022-08-30 ENCOUNTER — Encounter: Payer: Self-pay | Admitting: *Deleted

## 2022-08-30 DIAGNOSIS — D170 Benign lipomatous neoplasm of skin and subcutaneous tissue of head, face and neck: Secondary | ICD-10-CM

## 2022-09-04 ENCOUNTER — Ambulatory Visit: Payer: 59 | Admitting: Surgery

## 2022-09-04 ENCOUNTER — Other Ambulatory Visit: Payer: Self-pay | Admitting: *Deleted

## 2022-09-04 ENCOUNTER — Encounter: Payer: Self-pay | Admitting: Surgery

## 2022-09-04 VITALS — BP 119/79 | HR 74 | Temp 98.3°F | Resp 12 | Ht 63.0 in | Wt 133.0 lb

## 2022-09-04 DIAGNOSIS — R220 Localized swelling, mass and lump, head: Secondary | ICD-10-CM

## 2022-09-04 DIAGNOSIS — D170 Benign lipomatous neoplasm of skin and subcutaneous tissue of head, face and neck: Secondary | ICD-10-CM

## 2022-09-05 NOTE — Progress Notes (Signed)
[facility] History and Physical  Reason for Referral: Forehead lipoma Referring Physician: Dr. Manuella Ghazi  Chief Complaint   New Patient (Initial Visit)     Tracy Freeman is a 57 y.o. female.  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  It has been increasing in size recently and been causing itching and discomfort.  She denies any history of infection to the area.  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  She denies use of blood thinning medications.  She has a surgical history significant for hysterectomy, umbilical hernia repair, benign tumor excision on her left side, and tubal ligation.  She smokes 1 pack of cigarettes every 3 days and occasionally drinks alcohol.  She denies any illicit drug use.  Past Medical History:  Diagnosis Date   Anemia    Anxiety    Depression    Hypertension     Past Surgical History:  Procedure Laterality Date   CESAREAN SECTION  83 and 87-mchs and mmh   x2   excision of abdominal tumor     early 44's   [place]  03/07/2011   Procedure: HERNIA REPAIR INCISIONAL;  Surgeon: Jamesetta So;  Location: [facility];  Service: General;  Laterality: N/A;  Incisional Herniorraphy with Mesh   SUPRACERVICAL ABDOMINAL HYSTERECTOMY  03/07/2011   Procedure: HYSTERECTOMY SUPRACERVICAL ABDOMINAL;  Surgeon: Florian Buff, MD;  Location: [facility];  Service: Gynecology;  Laterality: N/A;   TUBAL LIGATION      Family History  Problem Relation Age of Onset   Diabetes Mother    Hypertension Mother    Throat cancer Father    Anesthesia problems Neg Hx    Hypotension Neg Hx    Malignant hyperthermia Neg Hx    Pseudochol deficiency Neg Hx     Social History   Tobacco Use   Smoking  status: Every Day    Packs/day: 0.25    Years: 10.00    Total pack years: 2.50    Types: Cigarettes  Vaping Use   Vaping Use: Never used  Substance Use Topics   Alcohol use: Yes    Alcohol/week: 6.0 standard drinks of alcohol    Types: 6 Cans of beer per week   Drug use: No    Medications: I have reviewed the patient's current medications. Allergies as of 09/04/2022       Reactions   Penicillins Other (See Comments)   Yeast infection        Medication List        Accurate as of September 04, 2022 11:59 PM. If you have any questions, ask your nurse or doctor.          STOP taking these medications    Calcium 600/Vitamin D 600-400 MG-UNIT tablet Generic drug: Calcium Carbonate-Vitamin D Stopped by: Anel Purohit A Thurmond Hildebran, DO       TAKE these medications    atenolol 50 MG tablet Commonly known as: TENORMIN Take 50 mg by mouth daily. 1/2 tablet once daily   ergocalciferol 1.25 MG (50000 UT) capsule Commonly known as: VITAMIN D2 Take 50,000 Units by mouth once a week.   Fish Oil 1200 MG Caps Take 1 capsule by mouth daily.   nabumetone 500  MG tablet Commonly known as: RELAFEN Take 500 mg by mouth 2 (two) times daily as needed.   rosuvastatin 5 MG tablet Commonly known as: CRESTOR Take 5 mg by mouth daily.         ROS:  Constitutional: negative for chills, fatigue, and fevers Eyes: negative for visual disturbance and pain Ears, nose, mouth, throat, and face: positive for sinus problems, negative for ear drainage and sore throat Respiratory: negative for cough, wheezing, and shortness of breath Cardiovascular: negative for chest pain and palpitations Gastrointestinal: negative for abdominal pain, nausea, reflux symptoms, and vomiting Genitourinary:negative for dysuria and frequency Integument/breast: negative for dryness and rash Hematologic/lymphatic: negative for bleeding and lymphadenopathy Musculoskeletal:positive for back pain Neurological:  negative for dizziness and tremors Endocrine: negative for temperature intolerance  Blood pressure 119/79, pulse 74, temperature 98.3 F (36.8 C), temperature source Oral, resp. rate 12, height 5' 3"$  (1.6 m), weight 133 lb (60.3 kg), last menstrual period 02/21/2011, SpO2 98 %. Physical Exam Vitals reviewed.  Constitutional:      Appearance: Normal appearance.  HENT:     Head: Normocephalic and atraumatic.  Eyes:     Extraocular Movements: Extraocular movements intact.     Pupils: Pupils are equal, round, and reactive to light.  Cardiovascular:     Rate and Rhythm: Normal rate and regular rhythm.  Pulmonary:     Effort: Pulmonary effort is normal.     Breath sounds: Normal breath sounds.  Abdominal:     General: There is no distension.     Palpations: Abdomen is soft.     Tenderness: There is no abdominal tenderness.  Musculoskeletal:        General: Normal range of motion.     Cervical back: Normal range of motion.  Skin:    General: Skin is warm and dry.     Comments: 4.5 to 5 cm mass on the left side of her forehead, nontender to palpation  Neurological:     General: No focal deficit present.     Mental Status: She is alert and oriented to person, place, and time.  Psychiatric:        Mood and Affect: Mood normal.        Behavior: Behavior normal.     Results: No results found for this or any previous visit (from the past 48 hour(s)).  No results found.   Assessment & Plan:  Tracy Freeman is a 58 y.o. female who presents for evaluation of a left forehead mass.  -I explained to the patient that this mass is likely either lipoma versus an epidermoid cyst.  Given the history of slowly increasing in size without previous infection or drainage, it is likely a lipoma -Because it is located on her forehead and it is fairly large in size, I recommend that she be evaluated by plastic surgeon for excision.  She may need a small amount of skin excised given the large  nature and location, and I feel she would be better served by a Psychiatric nurse -Will send a referral to Dr. Audelia Hives -I advised the patient to give our office a call if she is unable to be cared for by plastic surgery -Information provided to the patient regarding lipomas and cysts -Follow up as needed  All questions were answered to the satisfaction of the patient and family.  Graciella Freer, DO [facility] [address] [place], [place] [address] [phone] (office)

## 2022-09-18 ENCOUNTER — Ambulatory Visit: Payer: 59 | Admitting: Surgery

## 2022-09-24 DIAGNOSIS — R5383 Other fatigue: Secondary | ICD-10-CM | POA: Diagnosis not present

## 2022-09-24 DIAGNOSIS — Z0000 Encounter for general adult medical examination without abnormal findings: Secondary | ICD-10-CM | POA: Diagnosis not present

## 2022-09-24 DIAGNOSIS — E785 Hyperlipidemia, unspecified: Secondary | ICD-10-CM | POA: Diagnosis not present

## 2022-09-24 DIAGNOSIS — N898 Other specified noninflammatory disorders of vagina: Secondary | ICD-10-CM | POA: Diagnosis not present

## 2022-09-24 DIAGNOSIS — Z79899 Other long term (current) drug therapy: Secondary | ICD-10-CM | POA: Diagnosis not present

## 2022-10-01 ENCOUNTER — Telehealth: Payer: Self-pay | Admitting: Plastic Surgery

## 2022-10-01 ENCOUNTER — Encounter: Payer: Self-pay | Admitting: Plastic Surgery

## 2022-10-01 ENCOUNTER — Ambulatory Visit: Payer: 59 | Admitting: Plastic Surgery

## 2022-10-01 VITALS — BP 129/82 | HR 79 | Ht 64.0 in | Wt 138.0 lb

## 2022-10-01 DIAGNOSIS — M898X8 Other specified disorders of bone, other site: Secondary | ICD-10-CM

## 2022-10-01 NOTE — Telephone Encounter (Signed)
Reminder letter sent to the patient.

## 2022-10-01 NOTE — Progress Notes (Signed)
Patient ID: Tracy Freeman, female    DOB: 12-31-1965, 57 y.o.   MRN: [id number]   Chief Complaint  Patient presents with   consult    The patient is a 57 year old female here for evaluation of her forehead.  For several years that she has had a mass on her left forehead.  It has been getting larger.  She is now having to cover it with a leg because it is approximately 4 x 4 cm.  It is soft and does not feel like a bone.  There is no pulsation to it.  It feels like a lipoma.  He has anxiety and depression but is otherwise in good health.  She has had surgery in the past without any complications and no sign of keloid formation.    Review of Systems  Constitutional: Negative.   HENT: Negative.    Eyes: Negative.   Respiratory: Negative.  Negative for chest tightness.   Cardiovascular: Negative.   Gastrointestinal: Negative.   Endocrine: Negative.   Genitourinary: Negative.   Musculoskeletal: Negative.     Past Medical History:  Diagnosis Date   Anemia    Anxiety    Depression    Hypertension     Past Surgical History:  Procedure Laterality Date   CESAREAN SECTION  83 and 87-mchs and mmh   x2   excision of abdominal tumor     early 15's   [place]  03/07/2011   Procedure: HERNIA REPAIR INCISIONAL;  Surgeon: Jamesetta So;  Location: [facility];  Service: General;  Laterality: N/A;  Incisional Herniorraphy with Mesh   SUPRACERVICAL ABDOMINAL HYSTERECTOMY  03/07/2011   Procedure: HYSTERECTOMY SUPRACERVICAL ABDOMINAL;  Surgeon: Florian Buff, MD;  Location: [facility];  Service: Gynecology;  Laterality: N/A;   TUBAL LIGATION        Current Outpatient Medications:    atenolol (TENORMIN) 50 MG tablet, Take 50 mg by mouth daily. 1/2 tablet once daily, Disp: , Rfl:    ergocalciferol (VITAMIN D2) 1.25 MG (50000 UT) capsule, Take 50,000 Units by mouth once a week., Disp: , Rfl:    nabumetone (RELAFEN) 500 MG tablet, Take 500 mg by mouth 2 (two) times daily as  needed., Disp: , Rfl:    Omega-3 Fatty Acids (FISH OIL) 1200 MG CAPS, Take 1 capsule by mouth daily.  , Disp: , Rfl:    rosuvastatin (CRESTOR) 5 MG tablet, Take 5 mg by mouth daily., Disp: , Rfl:    Objective:   Vitals:   10/01/22 1445  BP: 129/82  Pulse: 79  SpO2: 97%    Physical Exam Vitals and nursing note reviewed.  Constitutional:      Appearance: Normal appearance.  HENT:     Head: Atraumatic.   Cardiovascular:     Rate and Rhythm: Normal rate.     Pulses: Normal pulses.  Abdominal:     Palpations: Abdomen is soft.  Skin:    General: Skin is warm.     Capillary Refill: Capillary refill takes less than 2 seconds.     Coloration: Skin is not jaundiced.     Findings: No bruising.  Neurological:     Mental Status: She is alert and oriented to person, place, and time.  Psychiatric:        Mood and Affect: Mood normal.        Behavior: Behavior normal.        Thought Content: Thought content normal.  Judgment: Judgment normal.     Assessment & Plan:  Mass of skull  Recommend excision of mass of forehead.  Due to location it would be best to do this in the operating room.  She will have a scar and she is aware of that and states that she understands and would rather have the scar than the mass.  Pictures were obtained of the patient and placed in the chart with the patient's or guardian's permission.   [place], DO

## 2022-10-16 DIAGNOSIS — E2839 Other primary ovarian failure: Secondary | ICD-10-CM | POA: Diagnosis not present

## 2022-10-16 DIAGNOSIS — Z111 Encounter for screening for respiratory tuberculosis: Secondary | ICD-10-CM | POA: Diagnosis not present

## 2022-10-18 DIAGNOSIS — Z111 Encounter for screening for respiratory tuberculosis: Secondary | ICD-10-CM | POA: Diagnosis not present

## 2022-11-15 ENCOUNTER — Telehealth: Payer: Self-pay | Admitting: *Deleted

## 2022-11-15 NOTE — Telephone Encounter (Signed)
LVM to schedule surgery

## 2022-11-19 ENCOUNTER — Encounter: Payer: Self-pay | Admitting: *Deleted

## 2022-11-20 ENCOUNTER — Encounter: Payer: Self-pay | Admitting: *Deleted

## 2022-11-22 ENCOUNTER — Telehealth (INDEPENDENT_AMBULATORY_CARE_PROVIDER_SITE_OTHER): Payer: 59 | Admitting: Surgical

## 2022-11-22 DIAGNOSIS — M898X8 Other specified disorders of bone, other site: Secondary | ICD-10-CM

## 2022-11-22 NOTE — Progress Notes (Signed)
Patient reported her phone was going to die, requested to reschedule pre-op. We discussed changing appointment to tomorrow at 9:45.

## 2022-11-23 ENCOUNTER — Telehealth (INDEPENDENT_AMBULATORY_CARE_PROVIDER_SITE_OTHER): Payer: Self-pay | Admitting: Surgical

## 2022-11-23 DIAGNOSIS — M898X8 Other specified disorders of bone, other site: Secondary | ICD-10-CM

## 2022-11-23 NOTE — Progress Notes (Signed)
No show

## 2022-11-27 ENCOUNTER — Other Ambulatory Visit: Payer: Self-pay

## 2022-11-27 ENCOUNTER — Encounter (HOSPITAL_BASED_OUTPATIENT_CLINIC_OR_DEPARTMENT_OTHER): Payer: Self-pay | Admitting: Plastic Surgery

## 2022-11-27 ENCOUNTER — Telehealth (INDEPENDENT_AMBULATORY_CARE_PROVIDER_SITE_OTHER): Payer: 59 | Admitting: Physician Assistant

## 2022-11-27 DIAGNOSIS — M898X8 Other specified disorders of bone, other site: Secondary | ICD-10-CM

## 2022-11-27 MED ORDER — ONDANSETRON 4 MG PO TBDP: 4.0000 mg | ORAL_TABLET | Freq: Three times a day (TID) | ORAL | 0 refills | Status: DC | PRN

## 2022-11-27 MED ORDER — OXYCODONE HCL 5 MG PO TABS: 5.0000 mg | ORAL_TABLET | Freq: Four times a day (QID) | ORAL | 0 refills | Status: AC | PRN

## 2022-11-27 MED ORDER — CLINDAMYCIN HCL 150 MG PO CAPS: 450.0000 mg | ORAL_CAPSULE | Freq: Three times a day (TID) | ORAL | 0 refills | Status: AC

## 2022-11-27 NOTE — Progress Notes (Signed)
Patient ID: Tracy Freeman, female    DOB: 21-Sep-1965, 57 y.o.   MRN: [id number]  Chief Complaint  Patient presents with   Pre-op Exam      ICD-10-CM   1. Mass of skull  M89.8X8        History of Present Illness: LILYONNA Freeman is a 57 y.o.  female  with a history of left forehead mass.  She presents for preoperative evaluation for upcoming procedure, excision of left forehead mass, scheduled for 12/05/2022 with Dr. Ulice Bold.  The patient has not had problems with anesthesia.  Previous tubal ligation with hernia repair without complication.  She denies any personal or family history of blood clots or clotting disorder.  Denies any personal history of cancer, severe cardiac or pulmonary disease, or varicosities.  She only takes prescription medications for high blood pressure and cholesterol.  She will hold any vitamins and supplements 1 week prior to surgery.  She also will stop taking her prescribed NSAID for herniated disc 3 days prior to surgery and restart day after surgery.  She will take Tylenol during that time for any pain.  She endorses daily cigarette use, but states that she will abstain between now until after she has recovered from surgery.  She states that she typically smokes 1/3 pack/day.  She believes that Dr. Ulice Bold is already aware.  Updated her contact information in her chart.  She confirms that she will be able to facilitate transportation for her postoperative encounters.  Summary of Previous Visit: She was seen for consult Dr. Ulice Bold on 10/01/2022.  At that time, complained of mass on left side of forehead that has been getting progressively larger.  Measured approximately 4 x 4 cm.  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  States that she has had surgery in the past without complication from anesthesia and denies any history of hypertrophic scarring.  Discussed OR excision given the location.  Patient was agreeable.  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  Discussed with patient that she will not be cleared to return to her job until she is no longer taking prescription narcotics and would have to wait until she is at least 24 hours postop.  PMH Significant for: Left forehead mass, HTN, HLD, herniated disc.   Past Medical History: Allergies: Allergies  Allergen Reactions   Penicillins Other (See Comments)    Yeast infection    Current Medications:  Current Outpatient Medications:    clindamycin (CLEOCIN) 150 MG capsule, Take 3 capsules (450 mg total) by mouth 3 (three) times daily for 5 days., Disp: 45 capsule, Rfl: 0   ondansetron (ZOFRAN-ODT) 4 MG disintegrating tablet, Take 1 tablet (4 mg total) by mouth every 8 (eight) hours as needed for nausea or vomiting., Disp: 20 tablet, Rfl: 0   oxyCODONE (ROXICODONE) 5 MG immediate release tablet, Take 1 tablet (5 mg total) by mouth every 6 (six) hours as needed for up to 5 days for severe pain., Disp: 12 tablet, Rfl: 0   atenolol (TENORMIN) 50 MG tablet, Take 50 mg by mouth daily. 1/2 tablet once daily, Disp: , Rfl:    ergocalciferol (VITAMIN D2) 1.25 MG (50000 UT) capsule, Take 50,000 Units by mouth once a week., Disp: , Rfl:    nabumetone (RELAFEN) 500 MG tablet, Take 500 mg by mouth 2 (two) times daily as needed., Disp: , Rfl:    Omega-3 Fatty Acids (FISH OIL) 1200 MG CAPS, Take 1 capsule by  mouth daily.  , Disp: , Rfl:    rosuvastatin (CRESTOR) 5 MG tablet, Take 5 mg by mouth daily., Disp: , Rfl:   Past Medical Problems: Past Medical History:  Diagnosis Date   Anemia    Anxiety    Depression    Hypertension     Past Surgical History: Past Surgical History:  Procedure Laterality Date   CESAREAN SECTION  67 and 87-mchs and mmh   x2   excision of abdominal tumor     early 42's   INCISIONAL HERNIA REPAIR  03/07/2011   Procedure: HERNIA REPAIR INCISIONAL;  Surgeon: Dalia Heading;  Location: [facility];  Service: General;  Laterality: N/A;  Incisional Herniorraphy with  Mesh   SUPRACERVICAL ABDOMINAL HYSTERECTOMY  03/07/2011   Procedure: HYSTERECTOMY SUPRACERVICAL ABDOMINAL;  Surgeon: Lazaro Arms, MD;  Location: [facility];  Service: Gynecology;  Laterality: N/A;   TUBAL LIGATION      Social History: Social History   Socioeconomic History   Marital status: Single    Spouse name: Not on file   Number of children: 2   Years of education: Not on file   Highest education level: High school graduate  Occupational History   Not on file  Tobacco Use   Smoking status: Every Day    Packs/day: 0.25    Years: 10.00    Additional pack years: 0.00    Total pack years: 2.50    Types: Cigarettes   Smokeless tobacco: Not on file  Vaping Use   Vaping Use: Never used  Substance and Sexual Activity   Alcohol use: Yes    Alcohol/week: 6.0 standard drinks of alcohol    Types: 6 Cans of beer per week   Drug use: No   Sexual activity: Not Currently    Birth control/protection: Surgical  Other Topics Concern   Not on file  Social History Narrative   Not on file   Social Determinants of Health   Financial Resource Strain: Not on file  Food Insecurity: No Food Insecurity (06/01/2022)   Hunger Vital Sign    Worried About Running Out of Food in the Last Year: Never true    Ran Out of Food in the Last Year: Never true  Transportation Needs: No Transportation Needs (06/01/2022)   PRAPARE - Administrator, Civil Service (Medical): No    Lack of Transportation (Non-Medical): No  Physical Activity: Not on file  Stress: Not on file  Social Connections: Not on file  Intimate Partner Violence: Not on file    Family History: Family History  Problem Relation Age of Onset   Diabetes Mother    Hypertension Mother    Throat cancer Father    Anesthesia problems Neg Hx    Hypotension Neg Hx    Malignant hyperthermia Neg Hx    Pseudochol deficiency Neg Hx     Review of Systems: ROS Denies any recent fevers, chest pain, SOB, or leg swelling.     Physical Exam: Vital Signs LMP 02/21/2011   Physical Exam Deferred.  Telephone encounter.   Assessment/Plan: The patient is scheduled for excision of left-sided forehead mass with Dr. Ulice Bold.  Risks, benefits, and alternatives of procedure discussed, questions answered and consent obtained.    Smoking Status: Current every day smoker; Counseling Given?  Discussed immediate cessation.  Discussed risks for postoperative complication including but not limited to poor wound healing and infection.  Caprini Score: 4; Risk Factors include: Age, tobacco use and length of  planned surgery. Recommendation for mechanical prophylaxis. Encourage early ambulation.   Pictures obtained: 10/01/2022.  Post-op Rx sent to pharmacy: Clindamycin, Zofran, Oxycodone.    Patient was provided with the General Surgical Risk consent document and Pain Medication Agreement prior to their appointment.  They had adequate time to read through the risk consent documents and Pain Medication Agreement. We also discussed them in person together during this preop appointment. All of their questions were answered to their satisfaction.  Recommended calling if they have any further questions.  Risk consent form and Pain Medication Agreement to be scanned into patient's chart.   Electronically signed by: Evelena Leyden, PA-C 11/27/2022 10:24 AM

## 2022-11-27 NOTE — H&P (View-Only) (Signed)
   Patient ID: Tracy Freeman, female    DOB: 03/12/1966, 57 y.o.   MRN: [id number]  Chief Complaint  Patient presents with   Pre-op Exam      ICD-10-CM   1. Mass of skull  M89.8X8        History of Present Illness: Tracy Freeman is a 57 y.o.  female  with a history of left forehead mass.  She presents for preoperative evaluation for upcoming procedure, excision of left forehead mass, scheduled for 12/05/2022 with Dr. Dillingham.  The patient has not had problems with anesthesia.  Previous tubal ligation with hernia repair without complication.  She denies any personal or family history of blood clots or clotting disorder.  Denies any personal history of cancer, severe cardiac or pulmonary disease, or varicosities.  She only takes prescription medications for high blood pressure and cholesterol.  She will hold any vitamins and supplements 1 week prior to surgery.  She also will stop taking her prescribed NSAID for herniated disc 3 days prior to surgery and restart day after surgery.  She will take Tylenol during that time for any pain.  She endorses daily cigarette use, but states that she will abstain between now until after she has recovered from surgery.  She states that she typically smokes 1/3 pack/day.  She believes that Dr. Dillingham is already aware.  Updated her contact information in her chart.  She confirms that she will be able to facilitate transportation for her postoperative encounters.  Summary of Previous Visit: She was seen for consult Dr. Dillingham on 10/01/2022.  At that time, complained of mass on left side of forehead that has been getting progressively larger.  Measured approximately 4 x 4 cm.  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  States that she has had surgery in the past without complication from anesthesia and denies any history of hypertrophic scarring.  Discussed OR excision given the location.  Patient was agreeable.  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  Discussed with patient that she will not be cleared to return to her job until she is no longer taking prescription narcotics and would have to wait until she is at least 24 hours postop.  PMH Significant for: Left forehead mass, HTN, HLD, herniated disc.   Past Medical History: Allergies: Allergies  Allergen Reactions   Penicillins Other (See Comments)    Yeast infection    Current Medications:  Current Outpatient Medications:    clindamycin (CLEOCIN) 150 MG capsule, Take 3 capsules (450 mg total) by mouth 3 (three) times daily for 5 days., Disp: 45 capsule, Rfl: 0   ondansetron (ZOFRAN-ODT) 4 MG disintegrating tablet, Take 1 tablet (4 mg total) by mouth every 8 (eight) hours as needed for nausea or vomiting., Disp: 20 tablet, Rfl: 0   oxyCODONE (ROXICODONE) 5 MG immediate release tablet, Take 1 tablet (5 mg total) by mouth every 6 (six) hours as needed for up to 5 days for severe pain., Disp: 12 tablet, Rfl: 0   atenolol (TENORMIN) 50 MG tablet, Take 50 mg by mouth daily. 1/2 tablet once daily, Disp: , Rfl:    ergocalciferol (VITAMIN D2) 1.25 MG (50000 UT) capsule, Take 50,000 Units by mouth once a week., Disp: , Rfl:    nabumetone (RELAFEN) 500 MG tablet, Take 500 mg by mouth 2 (two) times daily as needed., Disp: , Rfl:    Omega-3 Fatty Acids (FISH OIL) 1200 MG CAPS, Take 1 capsule by   mouth daily.  , Disp: , Rfl:    rosuvastatin (CRESTOR) 5 MG tablet, Take 5 mg by mouth daily., Disp: , Rfl:   Past Medical Problems: Past Medical History:  Diagnosis Date   Anemia    Anxiety    Depression    Hypertension     Past Surgical History: Past Surgical History:  Procedure Laterality Date   CESAREAN SECTION  83 and 87-mchs and mmh   x2   excision of abdominal tumor     early 80's   INCISIONAL HERNIA REPAIR  03/07/2011   Procedure: HERNIA REPAIR INCISIONAL;  Surgeon: Mark A Jenkins;  Location: [facility];  Service: General;  Laterality: N/A;  Incisional Herniorraphy with  Mesh   SUPRACERVICAL ABDOMINAL HYSTERECTOMY  03/07/2011   Procedure: HYSTERECTOMY SUPRACERVICAL ABDOMINAL;  Surgeon: Luther H Eure, MD;  Location: [facility];  Service: Gynecology;  Laterality: N/A;   TUBAL LIGATION      Social History: Social History   Socioeconomic History   Marital status: Single    Spouse name: Not on file   Number of children: 2   Years of education: Not on file   Highest education level: High school graduate  Occupational History   Not on file  Tobacco Use   Smoking status: Every Day    Packs/day: 0.25    Years: 10.00    Additional pack years: 0.00    Total pack years: 2.50    Types: Cigarettes   Smokeless tobacco: Not on file  Vaping Use   Vaping Use: Never used  Substance and Sexual Activity   Alcohol use: Yes    Alcohol/week: 6.0 standard drinks of alcohol    Types: 6 Cans of beer per week   Drug use: No   Sexual activity: Not Currently    Birth control/protection: Surgical  Other Topics Concern   Not on file  Social History Narrative   Not on file   Social Determinants of Health   Financial Resource Strain: Not on file  Food Insecurity: No Food Insecurity (06/01/2022)   Hunger Vital Sign    Worried About Running Out of Food in the Last Year: Never true    Ran Out of Food in the Last Year: Never true  Transportation Needs: No Transportation Needs (06/01/2022)   PRAPARE - Transportation    Lack of Transportation (Medical): No    Lack of Transportation (Non-Medical): No  Physical Activity: Not on file  Stress: Not on file  Social Connections: Not on file  Intimate Partner Violence: Not on file    Family History: Family History  Problem Relation Age of Onset   Diabetes Mother    Hypertension Mother    Throat cancer Father    Anesthesia problems Neg Hx    Hypotension Neg Hx    Malignant hyperthermia Neg Hx    Pseudochol deficiency Neg Hx     Review of Systems: ROS Denies any recent fevers, chest pain, SOB, or leg swelling.     Physical Exam: Vital Signs LMP 02/21/2011   Physical Exam Deferred.  Telephone encounter.   Assessment/Plan: The patient is scheduled for excision of left-sided forehead mass with Dr. Dillingham.  Risks, benefits, and alternatives of procedure discussed, questions answered and consent obtained.    Smoking Status: Current every day smoker; Counseling Given?  Discussed immediate cessation.  Discussed risks for postoperative complication including but not limited to poor wound healing and infection.  Caprini Score: 4; Risk Factors include: Age, tobacco use and length of   planned surgery. Recommendation for mechanical prophylaxis. Encourage early ambulation.   Pictures obtained: 10/01/2022.  Post-op Rx sent to pharmacy: Clindamycin, Zofran, Oxycodone.    Patient was provided with the General Surgical Risk consent document and Pain Medication Agreement prior to their appointment.  They had adequate time to read through the risk consent documents and Pain Medication Agreement. We also discussed them in person together during this preop appointment. All of their questions were answered to their satisfaction.  Recommended calling if they have any further questions.  Risk consent form and Pain Medication Agreement to be scanned into patient's chart.   Electronically signed by: Lexii Walsh, PA-C 11/27/2022 10:24 AM 

## 2022-12-03 ENCOUNTER — Ambulatory Visit (INDEPENDENT_AMBULATORY_CARE_PROVIDER_SITE_OTHER): Payer: 59 | Admitting: Physician Assistant

## 2022-12-03 VITALS — BP 134/86 | HR 76

## 2022-12-03 DIAGNOSIS — M898X8 Other specified disorders of bone, other site: Secondary | ICD-10-CM

## 2022-12-03 MED ORDER — OXYCODONE HCL 5 MG PO TABS: 5.0000 mg | ORAL_TABLET | Freq: Four times a day (QID) | ORAL | 0 refills | Status: AC | PRN

## 2022-12-03 NOTE — H&P (View-Only) (Signed)
Please see the 11/27/2022 video visit encounter for preoperative exam.  Plan is for excision of left forehead mass, likely lipoma, 12/05/2022 with Dr. Dillingham.  Today she simply presented to the office to complete her consent forms in person since she was unable to reply with her consent over MyChart.    Discussed the specific risks of her excision of left forehead mass, patient is again agreeable to proceed.  She did not fill her prescription for oxycodone and will resend that today.  She plans to pick up all of her postoperative medications in the next couple of days.   

## 2022-12-03 NOTE — Progress Notes (Signed)
Please see the 11/27/2022 video visit encounter for preoperative exam.  Plan is for excision of left forehead mass, likely lipoma, 12/05/2022 with Dr. Ulice Bold.  Today she simply presented to the office to complete her consent forms in person since she was unable to reply with her consent over MyChart.    Discussed the specific risks of her excision of left forehead mass, patient is again agreeable to proceed.  She did not fill her prescription for oxycodone and will resend that today.  She plans to pick up all of her postoperative medications in the next couple of days.

## 2022-12-05 ENCOUNTER — Encounter (HOSPITAL_BASED_OUTPATIENT_CLINIC_OR_DEPARTMENT_OTHER)
Admission: RE | Disposition: A | Discharge status: Home / Self Care | Payer: Self-pay | Source: Ambulatory Visit | Attending: Plastic Surgery

## 2022-12-05 ENCOUNTER — Ambulatory Visit (HOSPITAL_BASED_OUTPATIENT_CLINIC_OR_DEPARTMENT_OTHER): Payer: 59 | Admitting: Anesthesiology

## 2022-12-05 ENCOUNTER — Encounter (HOSPITAL_BASED_OUTPATIENT_CLINIC_OR_DEPARTMENT_OTHER): Payer: Self-pay | Admitting: Plastic Surgery

## 2022-12-05 ENCOUNTER — Ambulatory Visit (HOSPITAL_BASED_OUTPATIENT_CLINIC_OR_DEPARTMENT_OTHER)
Admission: RE | Admit: 2022-12-05 | Discharge: 2022-12-05 | Disposition: A | Discharge status: Home / Self Care | Payer: 59 | Source: Ambulatory Visit | Attending: Plastic Surgery | Admitting: Plastic Surgery

## 2022-12-05 ENCOUNTER — Other Ambulatory Visit: Payer: Self-pay

## 2022-12-05 DIAGNOSIS — F32A Depression, unspecified: Secondary | ICD-10-CM | POA: Insufficient documentation

## 2022-12-05 DIAGNOSIS — E785 Hyperlipidemia, unspecified: Secondary | ICD-10-CM | POA: Diagnosis not present

## 2022-12-05 DIAGNOSIS — F419 Anxiety disorder, unspecified: Secondary | ICD-10-CM | POA: Diagnosis not present

## 2022-12-05 DIAGNOSIS — F17210 Nicotine dependence, cigarettes, uncomplicated: Secondary | ICD-10-CM

## 2022-12-05 DIAGNOSIS — D170 Benign lipomatous neoplasm of skin and subcutaneous tissue of head, face and neck: Secondary | ICD-10-CM

## 2022-12-05 DIAGNOSIS — F418 Other specified anxiety disorders: Secondary | ICD-10-CM | POA: Diagnosis not present

## 2022-12-05 DIAGNOSIS — I10 Essential (primary) hypertension: Secondary | ICD-10-CM | POA: Diagnosis not present

## 2022-12-05 DIAGNOSIS — Z01818 Encounter for other preprocedural examination: Secondary | ICD-10-CM

## 2022-12-05 HISTORY — PX: LIPOMA EXCISION: SHX5283

## 2022-12-05 SURGERY — EXCISION LIPOMA
Anesthesia: General | Site: Face | Laterality: Left

## 2022-12-05 MED ORDER — PROMETHAZINE HCL 25 MG/ML IJ SOLN: 6.2500 mg | INTRAMUSCULAR | Status: DC | PRN

## 2022-12-05 MED ORDER — ACETAMINOPHEN 325 MG RE SUPP: 650.0000 mg | RECTAL | Status: DC | PRN

## 2022-12-05 MED ORDER — FENTANYL CITRATE (PF) 100 MCG/2ML IJ SOLN: 25.0000 ug | INTRAMUSCULAR | Status: DC | PRN

## 2022-12-05 MED ORDER — PROPOFOL 10 MG/ML IV BOLUS
INTRAVENOUS | Status: DC | PRN
  Administered 2022-12-05: 200 mg via INTRAVENOUS

## 2022-12-05 MED ORDER — FENTANYL CITRATE (PF) 100 MCG/2ML IJ SOLN
INTRAMUSCULAR | Status: AC
  Filled 2022-12-05: qty 2

## 2022-12-05 MED ORDER — ACETAMINOPHEN 325 MG PO TABS: 650.0000 mg | ORAL_TABLET | ORAL | Status: DC | PRN

## 2022-12-05 MED ORDER — SODIUM CHLORIDE 0.9% FLUSH: 3.0000 mL | Freq: Two times a day (BID) | INTRAVENOUS | Status: DC

## 2022-12-05 MED ORDER — CIPROFLOXACIN IN D5W 400 MG/200ML IV SOLN
400.0000 mg | INTRAVENOUS | Status: AC
  Administered 2022-12-05: 400 mg via INTRAVENOUS

## 2022-12-05 MED ORDER — SODIUM CHLORIDE 0.9 % IV SOLN: 250.0000 mL | INTRAVENOUS | Status: DC | PRN

## 2022-12-05 MED ORDER — SODIUM CHLORIDE 0.9% FLUSH: 3.0000 mL | INTRAVENOUS | Status: DC | PRN

## 2022-12-05 MED ORDER — KETOROLAC TROMETHAMINE 30 MG/ML IJ SOLN: 30.0000 mg | Freq: Once | INTRAMUSCULAR | Status: DC | PRN

## 2022-12-05 MED ORDER — BUPIVACAINE-EPINEPHRINE (PF) 0.25% -1:200000 IJ SOLN
INTRAMUSCULAR | Status: AC
  Filled 2022-12-05: qty 30

## 2022-12-05 MED ORDER — LIDOCAINE-EPINEPHRINE (PF) 1 %-1:200000 IJ SOLN
INTRAMUSCULAR | Status: DC | PRN
  Administered 2022-12-05: 1 mL

## 2022-12-05 MED ORDER — LACTATED RINGERS IV SOLN: INTRAVENOUS | Status: DC

## 2022-12-05 MED ORDER — LIDOCAINE 2% (20 MG/ML) 5 ML SYRINGE
INTRAMUSCULAR | Status: AC
  Filled 2022-12-05: qty 5

## 2022-12-05 MED ORDER — ONDANSETRON HCL 4 MG/2ML IJ SOLN
INTRAMUSCULAR | Status: AC
  Filled 2022-12-05: qty 2

## 2022-12-05 MED ORDER — MIDAZOLAM HCL 5 MG/5ML IJ SOLN
INTRAMUSCULAR | Status: DC | PRN
  Administered 2022-12-05: 2 mg via INTRAVENOUS

## 2022-12-05 MED ORDER — OXYCODONE HCL 5 MG PO TABS: 5.0000 mg | ORAL_TABLET | ORAL | Status: DC | PRN

## 2022-12-05 MED ORDER — CHLORHEXIDINE GLUCONATE CLOTH 2 % EX PADS: 6.0000 | MEDICATED_PAD | Freq: Once | CUTANEOUS | Status: DC

## 2022-12-05 MED ORDER — PROPOFOL 10 MG/ML IV BOLUS
INTRAVENOUS | Status: AC
  Filled 2022-12-05: qty 20

## 2022-12-05 MED ORDER — CIPROFLOXACIN IN D5W 400 MG/200ML IV SOLN
INTRAVENOUS | Status: AC
  Filled 2022-12-05: qty 200

## 2022-12-05 MED ORDER — LIDOCAINE 2% (20 MG/ML) 5 ML SYRINGE
INTRAMUSCULAR | Status: DC | PRN
  Administered 2022-12-05: 60 mg via INTRAVENOUS

## 2022-12-05 MED ORDER — ONDANSETRON HCL 4 MG/2ML IJ SOLN
INTRAMUSCULAR | Status: DC | PRN
  Administered 2022-12-05: 4 mg via INTRAVENOUS

## 2022-12-05 MED ORDER — OXYCODONE HCL 5 MG PO TABS
ORAL_TABLET | ORAL | Status: AC
  Filled 2022-12-05: qty 1

## 2022-12-05 MED ORDER — MIDAZOLAM HCL 2 MG/2ML IJ SOLN
INTRAMUSCULAR | Status: AC
  Filled 2022-12-05: qty 2

## 2022-12-05 MED ORDER — DEXAMETHASONE SODIUM PHOSPHATE 4 MG/ML IJ SOLN
INTRAMUSCULAR | Status: DC | PRN
  Administered 2022-12-05: 5 mg via INTRAVENOUS

## 2022-12-05 MED ORDER — OXYCODONE HCL 5 MG/5ML PO SOLN: 5.0000 mg | Freq: Once | ORAL | Status: AC | PRN

## 2022-12-05 MED ORDER — FENTANYL CITRATE (PF) 100 MCG/2ML IJ SOLN
INTRAMUSCULAR | Status: DC | PRN
  Administered 2022-12-05 (×2): 25 ug via INTRAVENOUS
  Administered 2022-12-05: 50 ug via INTRAVENOUS

## 2022-12-05 MED ORDER — OXYCODONE HCL 5 MG PO TABS
5.0000 mg | ORAL_TABLET | Freq: Once | ORAL | Status: AC | PRN
  Administered 2022-12-05: 5 mg via ORAL

## 2022-12-05 MED ORDER — ACETAMINOPHEN 500 MG PO TABS
1000.0000 mg | ORAL_TABLET | Freq: Once | ORAL | Status: AC
  Administered 2022-12-05: 1000 mg via ORAL

## 2022-12-05 MED ORDER — ACETAMINOPHEN 500 MG PO TABS
ORAL_TABLET | ORAL | Status: AC
  Filled 2022-12-05: qty 2

## 2022-12-05 MED ORDER — DEXAMETHASONE SODIUM PHOSPHATE 10 MG/ML IJ SOLN
INTRAMUSCULAR | Status: AC
  Filled 2022-12-05: qty 1

## 2022-12-05 MED ORDER — AMISULPRIDE (ANTIEMETIC) 5 MG/2ML IV SOLN: 10.0000 mg | Freq: Once | INTRAVENOUS | Status: DC | PRN

## 2022-12-05 SURGICAL SUPPLY — 39 items
ADH SKN CLS APL DERMABOND .7 (GAUZE/BANDAGES/DRESSINGS) ×1
BLADE CLIPPER SURG (BLADE) IMPLANT
BLADE HEX COATED 2.75 (ELECTRODE) IMPLANT
BLADE SURG 15 STRL LF DISP TIS (BLADE) ×1 IMPLANT
BLADE SURG 15 STRL SS (BLADE) ×1
CANISTER SUCT 1200ML W/VALVE (MISCELLANEOUS) IMPLANT
COVER BACK TABLE 60X90IN (DRAPES) ×1 IMPLANT
COVER MAYO STAND STRL (DRAPES) ×1 IMPLANT
DERMABOND ADVANCED .7 DNX12 (GAUZE/BANDAGES/DRESSINGS) IMPLANT
DRAPE LAPAROTOMY 100X72 PEDS (DRAPES) IMPLANT
DRAPE U-SHAPE 76X120 STRL (DRAPES) IMPLANT
ELECT COATED BLADE 2.86 ST (ELECTRODE) IMPLANT
ELECT REM PT RETURN 9FT ADLT (ELECTROSURGICAL) ×1
ELECTRODE REM PT RTRN 9FT ADLT (ELECTROSURGICAL) IMPLANT
GAUZE SPONGE 2X2 STRL 8-PLY (GAUZE/BANDAGES/DRESSINGS) IMPLANT
GAUZE SPONGE 4X4 12PLY STRL LF (GAUZE/BANDAGES/DRESSINGS) IMPLANT
GLOVE BIO SURGEON STRL SZ 6.5 (GLOVE) ×1 IMPLANT
GOWN STRL REUS W/ TWL LRG LVL3 (GOWN DISPOSABLE) ×1 IMPLANT
GOWN STRL REUS W/TWL LRG LVL3 (GOWN DISPOSABLE) ×2
NDL HYPO 25X1 1.5 SAFETY (NEEDLE) IMPLANT
NEEDLE HYPO 25X1 1.5 SAFETY (NEEDLE) ×1 IMPLANT
NS IRRIG 1000ML POUR BTL (IV SOLUTION) IMPLANT
PACK BASIN DAY SURGERY FS (CUSTOM PROCEDURE TRAY) ×1 IMPLANT
PENCIL SMOKE EVACUATOR (MISCELLANEOUS) IMPLANT
STRIP CLOSURE SKIN 1/2X4 (GAUZE/BANDAGES/DRESSINGS) IMPLANT
SUCTION FRAZIER HANDLE 10FR (MISCELLANEOUS) ×1
SUCTION TUBE FRAZIER 10FR DISP (MISCELLANEOUS) IMPLANT
SUT ETHILON 4 0 PS 2 18 (SUTURE) IMPLANT
SUT MNCRL 6-0 UNDY P1 1X18 (SUTURE) IMPLANT
SUT MNCRL AB 4-0 PS2 18 (SUTURE) IMPLANT
SUT MON AB 3-0 SH 27 (SUTURE)
SUT MON AB 3-0 SH27 (SUTURE) IMPLANT
SUT MON AB 5-0 PS2 18 (SUTURE) ×1 IMPLANT
SYR BULB EAR ULCER 3OZ GRN STR (SYRINGE) IMPLANT
SYR CONTROL 10ML LL (SYRINGE) ×1 IMPLANT
TOWEL GREEN STERILE FF (TOWEL DISPOSABLE) ×1 IMPLANT
TRAY DSU PREP LF (CUSTOM PROCEDURE TRAY) ×1 IMPLANT
TUBE CONNECTING 20X1/4 (TUBING) IMPLANT
YANKAUER SUCT BULB TIP NO VENT (SUCTIONS) IMPLANT

## 2022-12-05 NOTE — Interval H&P Note (Signed)
History and Physical Interval Note:  12/05/2022 8:30 AM  Tracy Freeman  has presented today for surgery, with the diagnosis of lipoma of face.  The various methods of treatment have been discussed with the patient and family. After consideration of risks, benefits and other options for treatment, the patient has consented to  Procedure(s): EXCISION LIPOMA (Left) as a surgical intervention.  The patient's history has been reviewed, patient examined, no change in status, stable for surgery.  I have reviewed the patient's chart and labs.  Questions were answered to the patient's satisfaction.     Alena Bills Champion Corales

## 2022-12-05 NOTE — Op Note (Signed)
DATE OF OPERATION: 12/05/2022  LOCATION: [facility]  PREOPERATIVE DIAGNOSIS: mass of forehead  POSTOPERATIVE DIAGNOSIS: Lipoma of forehead  PROCEDURE: Excision of 4 cm lipoma of forehead  SURGEON: Lamel Mccarley Sanger Tsion Inghram, DO  ASSISTANT: Evelena Leyden, PA  EBL: none  CONDITION: Stable  COMPLICATIONS: None  INDICATION: The patient, Tracy Freeman, is a 57 y.o. female born on [birth year], is here for treatment of a mass on her forehead.   PROCEDURE DETAILS:  The patient was seen prior to surgery and marked.  The IV antibiotics were given. The patient was taken to the operating room and given a general anesthetic. A standard time out was performed and all information was confirmed by those in the room. SCDs were placed.   The patient was prepped and draped.  Local was injected into the skin over the area of the forehead lesion.  The #15 blade was used to make an incision of the lesion.  The bovie was used to dissect to the lesion.  It had the consistency of a lipoma.  The entire 4 cm lesion was excised.  Hemostasis was achieved with electrocautery. The skin was closed with the 6-0 Monocryl followed by dermabond and steri strips.  The patient was allowed to wake up and taken to recovery room in stable condition at the end of the case. The family was notified at the end of the case.   The advanced practice practitioner (APP) assisted throughout the case.  The APP was essential in retraction and counter traction when needed to make the case progress smoothly.  This retraction and assistance made it possible to see the tissue plans for the procedure.  The assistance was needed for blood control, tissue re-approximation and assisted with closure of the incision site.

## 2022-12-05 NOTE — Progress Notes (Signed)
Patient is a pleasant 57 year old female s/p excision of left forehead mass performed 12/05/2022 by Dr. Ulice Bold who joins via telephone for postoperative day 2 check-in.  The patient was at home and this provider was calling from their office.  A total of 5 minutes was spent speaking with the patient and reviewing chart.    Patient states that she is doing extremely well since time of surgery.  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  She denies any leg swelling, chest pain, difficulty breathing, fevers, or other concerns.  She is not requiring any narcotic pain medication.  She is pleased with the outcome thus far.  Tolerating p.o. intake without difficulty.  Voiding.  Ambulatory.  Will plan to come in for her appointment with Dr. Ulice Bold next week, as scheduled.    Pathology was reviewed and consistent with lipoma.  Discussed with patient who is pleased.  Plan is to simply leave the Steri-Strip in place and to not aggressively rub at the excision site.  She understands that she can call the on-call service should she have any questions or concerns over the weekend.

## 2022-12-05 NOTE — Transfer of Care (Signed)
Immediate Anesthesia Transfer of Care Note  Patient: Tracy Freeman  Procedure(s) Performed: EXCISION LIPOMA (Left: Face)  Patient Location: PACU  Anesthesia Type:General  Level of Consciousness: drowsy, patient cooperative, and responds to stimulation  Airway & Oxygen Therapy: Patient Spontanous Breathing and Patient connected to face mask oxygen  Post-op Assessment: Report given to RN and Post -op Vital signs reviewed and stable  Post vital signs: Reviewed and stable  Last Vitals:  Vitals Value Taken Time  BP    Temp    Pulse 81 12/05/22 1054  Resp    SpO2 99 % 12/05/22 1054  Vitals shown include unvalidated device data.  Last Pain:  Vitals:   12/05/22 0831  TempSrc: Tympanic  PainSc: 0-No pain      Patients Stated Pain Goal: 5 (12/05/22 0831)  Complications: No notable events documented.

## 2022-12-05 NOTE — Anesthesia Preprocedure Evaluation (Addendum)
Anesthesia Evaluation  Patient identified by MRN, date of birth, ID band Patient awake    Reviewed: Allergy & Precautions, NPO status , Patient's Chart, lab work & pertinent test results  Airway Mallampati: II  TM Distance: >3 FB Neck ROM: Full    Dental  (+) Edentulous Upper   Pulmonary Current Smoker and Patient abstained from smoking.   Pulmonary exam normal        Cardiovascular hypertension, Pt. on home beta blockers Normal cardiovascular exam Rate:Normal     Neuro/Psych  PSYCHIATRIC DISORDERS Anxiety Depression    negative neurological ROS     GI/Hepatic negative GI ROS,,,(+)     substance abuse    Endo/Other  negative endocrine ROS    Renal/GU negative Renal ROS     Musculoskeletal negative musculoskeletal ROS (+)    Abdominal   Peds  Hematology negative hematology ROS (+) REFUSES BLOOD PRODUCTS  Anesthesia Other Findings   Reproductive/Obstetrics                             Anesthesia Physical Anesthesia Plan  ASA: 2  Anesthesia Plan: General   Post-op Pain Management:    Induction: Intravenous  PONV Risk Score and Plan: 2 and Ondansetron, Dexamethasone, Midazolam and Treatment may vary due to age or medical condition  Airway Management Planned: LMA  Additional Equipment:   Intra-op Plan:   Post-operative Plan: Extubation in OR  Informed Consent: I have reviewed the patients History and Physical, chart, labs and discussed the procedure including the risks, benefits and alternatives for the proposed anesthesia with the patient or authorized representative who has indicated his/her understanding and acceptance.     Dental advisory given  Plan Discussed with: CRNA  Anesthesia Plan Comments:        Anesthesia Quick Evaluation

## 2022-12-05 NOTE — Anesthesia Procedure Notes (Signed)
Procedure Name: LMA Insertion Date/Time: 12/05/2022 10:23 AM  Performed by: Thornell Mule, CRNAPre-anesthesia Checklist: Patient identified, Emergency Drugs available, Suction available and Patient being monitored Patient Re-evaluated:Patient Re-evaluated prior to induction Oxygen Delivery Method: Circle system utilized Preoxygenation: Pre-oxygenation with 100% oxygen Induction Type: IV induction LMA: LMA inserted LMA Size: 3.0 Number of attempts: 1 Placement Confirmation: positive ETCO2 Tube secured with: Tape Dental Injury: Teeth and Oropharynx as per pre-operative assessment

## 2022-12-05 NOTE — Interval H&P Note (Signed)
History and Physical Interval Note:  12/05/2022 8:30 AM  Tracy Freeman  has presented today for surgery, with the diagnosis of lipoma of face.  The various methods of treatment have been discussed with the patient and family. After consideration of risks, benefits and other options for treatment, the patient has consented to  Procedure(s): EXCISION LIPOMA (Left) as a surgical intervention.  The patient's history has been reviewed, patient examined, no change in status, stable for surgery.  I have reviewed the patient's chart and labs.  Questions were answered to the patient's satisfaction.     Conswella Bruney S Kali Deadwyler   

## 2022-12-05 NOTE — Discharge Instructions (Addendum)
Keep head elevated as able. May shower in 1 day. Tylenol or Motrin as needed.   May have Tylenol today after 2:45 PM  Post Anesthesia Home Care Instructions  Activity: Get plenty of rest for the remainder of the day. A responsible individual must stay with you for 24 hours following the procedure.  For the next 24 hours, DO NOT: -Drive a car -Advertising copywriter -Drink alcoholic beverages -Take any medication unless instructed by your physician -Make any legal decisions or sign important papers.  Meals: Start with liquid foods such as gelatin or soup. Progress to regular foods as tolerated. Avoid greasy, spicy, heavy foods. If nausea and/or vomiting occur, drink only clear liquids until the nausea and/or vomiting subsides. Call your physician if vomiting continues.  Special Instructions/Symptoms: Your throat may feel dry or sore from the anesthesia or the breathing tube placed in your throat during surgery. If this causes discomfort, gargle with warm salt water. The discomfort should disappear within 24 hours.  If you had a scopolamine patch placed behind your ear for the management of post- operative nausea and/or vomiting:  1. The medication in the patch is effective for 72 hours, after which it should be removed.  Wrap patch in a tissue and discard in the trash. Wash hands thoroughly with soap and water. 2. You may remove the patch earlier than 72 hours if you experience unpleasant side effects which may include dry mouth, dizziness or visual disturbances. 3. Avoid touching the patch. Wash your hands with soap and water after contact with the patch.

## 2022-12-05 NOTE — Anesthesia Postprocedure Evaluation (Signed)
Anesthesia Post Note  Patient: Tracy Freeman  Procedure(s) Performed: EXCISION LIPOMA (Left: Face)     Patient location during evaluation: PACU Anesthesia Type: General Level of consciousness: awake Pain management: pain level controlled Vital Signs Assessment: post-procedure vital signs reviewed and stable Respiratory status: spontaneous breathing, nonlabored ventilation and respiratory function stable Cardiovascular status: blood pressure returned to baseline and stable Postop Assessment: no apparent nausea or vomiting Anesthetic complications: no   No notable events documented.  Last Vitals:  Vitals:   12/05/22 1115 12/05/22 1154  BP: (!) 133/90 (!) 161/70  Pulse: 71 70  Resp: 16 14  Temp:  (!) 36.2 C  SpO2: 94% 96%    Last Pain:  Vitals:   12/05/22 1154  TempSrc:   PainSc: 3                  Miche Loughridge P Kelty Szafran

## 2022-12-06 ENCOUNTER — Encounter (HOSPITAL_BASED_OUTPATIENT_CLINIC_OR_DEPARTMENT_OTHER): Payer: Self-pay | Admitting: Plastic Surgery

## 2022-12-06 ENCOUNTER — Other Ambulatory Visit: Payer: Self-pay

## 2022-12-06 LAB — SURGICAL PATHOLOGY

## 2022-12-07 ENCOUNTER — Ambulatory Visit (INDEPENDENT_AMBULATORY_CARE_PROVIDER_SITE_OTHER): Payer: 59 | Admitting: Physician Assistant

## 2022-12-07 DIAGNOSIS — D170 Benign lipomatous neoplasm of skin and subcutaneous tissue of head, face and neck: Secondary | ICD-10-CM

## 2022-12-12 ENCOUNTER — Ambulatory Visit (INDEPENDENT_AMBULATORY_CARE_PROVIDER_SITE_OTHER): Payer: 59 | Admitting: Plastic Surgery

## 2022-12-12 ENCOUNTER — Encounter: Payer: 59 | Admitting: Plastic Surgery

## 2022-12-12 ENCOUNTER — Encounter: Payer: Self-pay | Admitting: Plastic Surgery

## 2022-12-12 VITALS — BP 119/84 | HR 81 | Ht 63.0 in | Wt 132.0 lb

## 2022-12-12 DIAGNOSIS — Z9889 Other specified postprocedural states: Secondary | ICD-10-CM

## 2022-12-12 DIAGNOSIS — M898X8 Other specified disorders of bone, other site: Secondary | ICD-10-CM

## 2022-12-12 NOTE — Progress Notes (Signed)
The patient is a 57 year old female here for follow-up on her mass removal from her forehead.  The pathology showed benign lipoma.  She is quite pleased with the results.  I removed the stitch and Steri-Strips.  I placed a new Steri-Strip.  She should let this fall off.  She can get her hair done in 2 weeks.  Follow-up as needed.

## 2022-12-14 ENCOUNTER — Institutional Professional Consult (permissible substitution): Payer: 59 | Admitting: Plastic Surgery

## 2022-12-26 ENCOUNTER — Telehealth: Payer: Self-pay

## 2022-12-26 MED ORDER — FLUCONAZOLE 150 MG PO TABS: 150.0000 mg | ORAL_TABLET | Freq: Once | ORAL | 0 refills | Status: AC

## 2022-12-26 NOTE — Telephone Encounter (Signed)
I spoke with the patient this afternoon at approximately 6 PM. She reports itching in the vaginal area, reports she stopped taking the antibiotic a few days ago. She reports she is otherwise feeling fine at this time. She has no other complaints.   She is healing well from her procedure.  Wilson one time dose of Diflucan to patient's pharmacy. Recommend starting this evening or tomorrow a.m.

## 2022-12-26 NOTE — Telephone Encounter (Signed)
Patient verbalized she feels she is having allergic reaction from her anti-biotic medication which she does not have in front of her because she is at work.  Patient verbalized her reaction is having a yeast infection.  Patient verbalized she stopped taking the anti-biotic medication two weeks ago. Patient's scarring is good but still has numbness in head.  Please follow up with patient @ [phone], she gets off work at 3:45 pm, today, 12/26/2022.

## 2022-12-28 ENCOUNTER — Ambulatory Visit (INDEPENDENT_AMBULATORY_CARE_PROVIDER_SITE_OTHER): Payer: Self-pay | Admitting: Surgical

## 2022-12-28 ENCOUNTER — Encounter: Payer: Self-pay | Admitting: Surgical

## 2022-12-28 DIAGNOSIS — M898X8 Other specified disorders of bone, other site: Secondary | ICD-10-CM

## 2022-12-28 NOTE — Progress Notes (Signed)
Patient was scheduled today for a telephone visit to discuss surgery.  She underwent excision of forehead lipoma with Dr. Ulice Bold 12/05/2022.  I have called the patient twice, once this morning at 9:45 AM and at 10:38 AM.  No answer.  Did not leave voicemail message.  If patient returns call, please route to clinical staff

## 2023-01-11 DIAGNOSIS — F5102 Adjustment insomnia: Secondary | ICD-10-CM | POA: Diagnosis not present

## 2023-01-11 DIAGNOSIS — Z299 Encounter for prophylactic measures, unspecified: Secondary | ICD-10-CM | POA: Diagnosis not present

## 2023-01-11 DIAGNOSIS — I10 Essential (primary) hypertension: Secondary | ICD-10-CM | POA: Diagnosis not present

## 2023-02-06 DIAGNOSIS — G4700 Insomnia, unspecified: Secondary | ICD-10-CM | POA: Diagnosis not present

## 2023-02-06 DIAGNOSIS — F419 Anxiety disorder, unspecified: Secondary | ICD-10-CM | POA: Diagnosis not present

## 2023-02-06 DIAGNOSIS — I10 Essential (primary) hypertension: Secondary | ICD-10-CM | POA: Diagnosis not present

## 2023-02-06 DIAGNOSIS — M5430 Sciatica, unspecified side: Secondary | ICD-10-CM | POA: Diagnosis not present

## 2023-02-06 DIAGNOSIS — Z299 Encounter for prophylactic measures, unspecified: Secondary | ICD-10-CM | POA: Diagnosis not present

## 2023-03-06 DIAGNOSIS — H524 Presbyopia: Secondary | ICD-10-CM | POA: Diagnosis not present

## 2023-03-06 DIAGNOSIS — H5203 Hypermetropia, bilateral: Secondary | ICD-10-CM | POA: Diagnosis not present

## 2023-03-06 DIAGNOSIS — H2513 Age-related nuclear cataract, bilateral: Secondary | ICD-10-CM | POA: Diagnosis not present

## 2023-05-09 DIAGNOSIS — R519 Headache, unspecified: Secondary | ICD-10-CM | POA: Diagnosis not present

## 2023-05-09 DIAGNOSIS — S3992XA Unspecified injury of lower back, initial encounter: Secondary | ICD-10-CM | POA: Diagnosis not present

## 2023-05-09 DIAGNOSIS — S3993XA Unspecified injury of pelvis, initial encounter: Secondary | ICD-10-CM | POA: Diagnosis not present

## 2023-05-09 DIAGNOSIS — R1011 Right upper quadrant pain: Secondary | ICD-10-CM | POA: Diagnosis not present

## 2023-05-09 DIAGNOSIS — S299XXA Unspecified injury of thorax, initial encounter: Secondary | ICD-10-CM | POA: Diagnosis not present

## 2023-05-09 DIAGNOSIS — S3991XA Unspecified injury of abdomen, initial encounter: Secondary | ICD-10-CM | POA: Diagnosis not present

## 2023-10-22 ENCOUNTER — Encounter: Payer: Self-pay | Admitting: *Deleted

## 2023-11-21 ENCOUNTER — Telehealth: Payer: Self-pay | Admitting: *Deleted

## 2023-11-21 NOTE — Telephone Encounter (Signed)
  Procedure: COLONOSCOPY  Height: 5'4 Weight: 153lbs        Have you had a colonoscopy before?  no  Do you have family history of colon cancer?  Yes father and 2 of 1st cousins  Do you have a family history of polyps? no  Previous colonoscopy with polyps removed? no  Do you have a history colorectal cancer?   no  Are you diabetic?  no  Do you have a prosthetic or mechanical heart valve? no  Do you have a pacemaker/defibrillator?   no  Have you had endocarditis/atrial fibrillation?  no  Do you use supplemental oxygen/CPAP?  no  Have you had joint replacement within the last 12 months?  no  Do you tend to be constipated or have to use laxatives?  no   Do you have history of alcohol use? If yes, how much and how often.  yes  Do you have history or are you using drugs? If yes, what do are you  using?  no  Have you ever had a stroke/heart attack?  no  Have you ever had a heart or other vascular stent placed,?no  Do you take weight loss medication? no  female patients,: have you had a hysterectomy? yes                              are you post menopausal?  no                              do you still have your menstrual cycle? no    Date of last menstrual period?   Do you take any blood-thinning medications such as: (Plavix, aspirin, Coumadin, Aggrenox, Brilinta, Xarelto, Eliquis, Pradaxa, Savaysa or Effient)? no  If yes we need the name, milligram, dosage and who is prescribing doctor:               Current Outpatient Medications  Medication Sig Dispense Refill   FLUoxetine (PROZAC) 20 MG tablet Take 20 mg by mouth at bedtime.     HYDROcodone -acetaminophen  (NORCO/VICODIN) 5-325 MG tablet Take 1 tablet by mouth 4 (four) times daily.     naproxen (NAPROSYN) 500 MG tablet Take 500 mg by mouth 2 (two) times daily with a meal.     traZODone (DESYREL) 50 MG tablet Take 50 mg by mouth at bedtime.     atenolol (TENORMIN) 50 MG tablet Take 50 mg by mouth daily. 1/2 tablet once  daily     ergocalciferol (VITAMIN D2) 1.25 MG (50000 UT) capsule Take 50,000 Units by mouth once a week.     rosuvastatin (CRESTOR) 5 MG tablet Take 5 mg by mouth daily.     No current facility-administered medications for this visit.    Allergies  Allergen Reactions   Penicillins Other (See Comments)    Yeast infection   Red Blood Cells     Pt is Jehovah's Witness... no blood products. Pt states "Albumin products are OK to use"

## 2023-11-25 ENCOUNTER — Encounter (INDEPENDENT_AMBULATORY_CARE_PROVIDER_SITE_OTHER): Payer: Self-pay | Admitting: *Deleted

## 2023-11-25 MED ORDER — PEG 3350-KCL-NA BICARB-NACL 420 G PO SOLR: 4000.0000 mL | Freq: Once | ORAL | 0 refills | Status: AC

## 2023-11-25 NOTE — Addendum Note (Signed)
 Addended by: Feliz Hosteller on: 11/25/2023 02:28 PM   Modules accepted: Orders

## 2023-11-25 NOTE — Telephone Encounter (Signed)
 Spoke with pt. Scheduled with Dr. Mordechai April 6/3 at 2:15pm. She needed something that would have her arriving 12 or after. Aware will send instructions, rx for prep sent to pharmacy.

## 2023-11-25 NOTE — Telephone Encounter (Signed)
 Referral completed, [facility] apt letter sent to PCP

## 2023-11-25 NOTE — Telephone Encounter (Signed)
 ASA 2 - ok to schedule.

## 2023-12-17 ENCOUNTER — Ambulatory Visit (HOSPITAL_COMMUNITY): Admitting: Anesthesiology

## 2023-12-17 ENCOUNTER — Ambulatory Visit (HOSPITAL_COMMUNITY)
Admission: RE | Admit: 2023-12-17 | Discharge: 2023-12-17 | Disposition: A | Discharge status: Home / Self Care | Source: Ambulatory Visit | Attending: Internal Medicine | Admitting: Internal Medicine

## 2023-12-17 ENCOUNTER — Other Ambulatory Visit: Payer: Self-pay

## 2023-12-17 ENCOUNTER — Encounter (HOSPITAL_COMMUNITY)
Admission: RE | Disposition: A | Discharge status: Home / Self Care | Payer: Self-pay | Source: Ambulatory Visit | Attending: Internal Medicine

## 2023-12-17 ENCOUNTER — Encounter (HOSPITAL_COMMUNITY): Payer: Self-pay | Admitting: Internal Medicine

## 2023-12-17 DIAGNOSIS — K5730 Diverticulosis of large intestine without perforation or abscess without bleeding: Secondary | ICD-10-CM | POA: Diagnosis not present

## 2023-12-17 DIAGNOSIS — K648 Other hemorrhoids: Secondary | ICD-10-CM | POA: Insufficient documentation

## 2023-12-17 DIAGNOSIS — Z1211 Encounter for screening for malignant neoplasm of colon: Secondary | ICD-10-CM | POA: Diagnosis not present

## 2023-12-17 DIAGNOSIS — F32A Depression, unspecified: Secondary | ICD-10-CM | POA: Insufficient documentation

## 2023-12-17 DIAGNOSIS — Z800 Family history of malignant neoplasm of digestive organs: Secondary | ICD-10-CM | POA: Diagnosis not present

## 2023-12-17 DIAGNOSIS — D123 Benign neoplasm of transverse colon: Secondary | ICD-10-CM

## 2023-12-17 DIAGNOSIS — F17210 Nicotine dependence, cigarettes, uncomplicated: Secondary | ICD-10-CM | POA: Diagnosis not present

## 2023-12-17 DIAGNOSIS — F419 Anxiety disorder, unspecified: Secondary | ICD-10-CM | POA: Diagnosis not present

## 2023-12-17 DIAGNOSIS — I10 Essential (primary) hypertension: Secondary | ICD-10-CM | POA: Insufficient documentation

## 2023-12-17 HISTORY — PX: COLONOSCOPY: SHX5424

## 2023-12-17 SURGERY — COLONOSCOPY
Anesthesia: General

## 2023-12-17 MED ORDER — STERILE WATER FOR IRRIGATION IR SOLN
Status: DC | PRN
  Administered 2023-12-17: 50 mL

## 2023-12-17 MED ORDER — PROPOFOL 10 MG/ML IV BOLUS
INTRAVENOUS | Status: DC | PRN
  Administered 2023-12-17: 100 mg via INTRAVENOUS
  Administered 2023-12-17 (×2): 50 mg via INTRAVENOUS
  Administered 2023-12-17: 20 mg via INTRAVENOUS

## 2023-12-17 MED ORDER — LACTATED RINGERS IV SOLN: INTRAVENOUS | Status: DC

## 2023-12-17 MED ORDER — LACTATED RINGERS IV SOLN
INTRAVENOUS | Status: DC | PRN
Start: 2023-12-17 — End: 2023-12-17

## 2023-12-17 MED ORDER — LIDOCAINE 2% (20 MG/ML) 5 ML SYRINGE
INTRAMUSCULAR | Status: DC | PRN
  Administered 2023-12-17: 50 mg via INTRAVENOUS

## 2023-12-17 MED ORDER — PROPOFOL 500 MG/50ML IV EMUL
INTRAVENOUS | Status: DC | PRN
  Administered 2023-12-17: 150 ug/kg/min via INTRAVENOUS

## 2023-12-17 NOTE — Discharge Instructions (Addendum)
   Colonoscopy Discharge Instructions  Read the instructions outlined below and refer to this sheet in the next few weeks. These discharge instructions provide you with general information on caring for yourself after you leave the hospital. Your doctor may also give you specific instructions. While your treatment has been planned according to the most current medical practices available, unavoidable complications occasionally occur.   ACTIVITY You may resume your regular activity, but move at a slower pace for the next 24 hours.  Take frequent rest periods for the next 24 hours.  Walking will help get rid of the air and reduce the bloated feeling in your belly (abdomen).  No driving for 24 hours (because of the medicine (anesthesia) used during the test).   Do not sign any important legal documents or operate any machinery for 24 hours (because of the anesthesia used during the test).  NUTRITION Drink plenty of fluids.  You may resume your normal diet as instructed by your doctor.  Begin with a light meal and progress to your normal diet. Heavy or fried foods are harder to digest and may make you feel sick to your stomach (nauseated).  Avoid alcoholic beverages for 24 hours or as instructed.  MEDICATIONS You may resume your normal medications unless your doctor tells you otherwise.  WHAT YOU CAN EXPECT TODAY Some feelings of bloating in the abdomen.  Passage of more gas than usual.  Spotting of blood in your stool or on the toilet paper.  IF YOU HAD POLYPS REMOVED DURING THE COLONOSCOPY: No aspirin products for 7 days or as instructed.  No alcohol for 7 days or as instructed.  Eat a soft diet for the next 24 hours.  FINDING OUT THE RESULTS OF YOUR TEST Not all test results are available during your visit. If your test results are not back during the visit, make an appointment with your caregiver to find out the results. Do not assume everything is normal if you have not heard from your  caregiver or the medical facility. It is important for you to follow up on all of your test results.  SEEK IMMEDIATE MEDICAL ATTENTION IF: You have more than a spotting of blood in your stool.  Your belly is swollen (abdominal distention).  You are nauseated or vomiting.  You have a temperature over 101.  You have abdominal pain or discomfort that is severe or gets worse throughout the day.   Your colonoscopy revealed 1 polyp(s) which I removed successfully. Await pathology results, my office will contact you. I recommend repeating colonoscopy in 5 years for surveillance purposes.  You also have diverticulosis and internal hemorrhoids. I would recommend increasing fiber in your diet or adding OTC Benefiber/Metamucil. Be sure to drink at least 4 to 6 glasses of water daily. Follow-up with GI as needed.   I hope you have a great rest of your week!  Hennie Duos. Marletta Lor, D.O. Gastroenterology and Hepatology [facility]

## 2023-12-17 NOTE — Anesthesia Preprocedure Evaluation (Signed)
 Anesthesia Evaluation  Patient identified by MRN, date of birth, ID band Patient awake    Reviewed: Allergy & Precautions, H&P , NPO status , Patient's Chart, lab work & pertinent test results, reviewed documented beta blocker date and time   Airway Mallampati: II  TM Distance: >3 FB Neck ROM: full    Dental no notable dental hx.    Pulmonary neg pulmonary ROS, Current Smoker and Patient abstained from smoking.   Pulmonary exam normal breath sounds clear to auscultation       Cardiovascular Exercise Tolerance: Good hypertension,  Rhythm:regular Rate:Normal     Neuro/Psych  PSYCHIATRIC DISORDERS Anxiety Depression    negative neurological ROS     GI/Hepatic negative GI ROS, Neg liver ROS,,,  Endo/Other  negative endocrine ROS    Renal/GU negative Renal ROS  negative genitourinary   Musculoskeletal   Abdominal   Peds  Hematology  (+) Blood dyscrasia, anemia   Anesthesia Other Findings   Reproductive/Obstetrics negative OB ROS                             Anesthesia Physical Anesthesia Plan  ASA: 2  Anesthesia Plan: General   Post-op Pain Management:    Induction:   PONV Risk Score and Plan: Propofol  infusion  Airway Management Planned:   Additional Equipment:   Intra-op Plan:   Post-operative Plan:   Informed Consent: I have reviewed the patients History and Physical, chart, labs and discussed the procedure including the risks, benefits and alternatives for the proposed anesthesia with the patient or authorized representative who has indicated his/her understanding and acceptance.     Dental Advisory Given  Plan Discussed with: CRNA  Anesthesia Plan Comments:        Anesthesia Quick Evaluation

## 2023-12-17 NOTE — Op Note (Signed)
 [facility] Patient Name: Tracy Freeman Procedure Date: 12/17/2023 1:44 PM MRN: [id number] Date of Birth: 07-03-1966 Attending MD: Rolando Cliche. Roel Clarity, [id number] CSN: [id number] Age: 58 Admit Type: Outpatient Procedure:                Colonoscopy Indications:              Screening in patient at increased risk: Colorectal                            cancer in brother before age 93 Providers:                Rolando Cliche. Mordechai April, DO, Troy Furnish. Hazeline Lister RN, RN,                            Sharlette Dayhoff Technician, Technician Referring MD:              Medicines:                See the Anesthesia note for documentation of the                            administered medications Complications:            No immediate complications. Estimated Blood Loss:     Estimated blood loss was minimal. Procedure:                Pre-Anesthesia Assessment:                           - The anesthesia plan was to use monitored                            anesthesia care (MAC).                           After obtaining informed consent, the colonoscope                            was passed under direct vision. Throughout the                            procedure, the patient's blood pressure, pulse, and                            oxygen saturations were monitored continuously. The                            [id number]-HQ190L ([id number]) scope was introduced through                            the anus and advanced to the the cecum, identified                            by appendiceal orifice and ileocecal valve. The  colonoscopy was performed without difficulty. The                            patient tolerated the procedure well. The quality                            of the bowel preparation was evaluated using the                            BBPS [facility] Bowel Preparation Scale) with scores                            of: Right Colon = 2 (minor amount of residual                             staining, small fragments of stool and/or opaque                            liquid, but mucosa seen well), Transverse Colon = 2                            (minor amount of residual staining, small fragments                            of stool and/or opaque liquid, but mucosa seen                            well) and Left Colon = 3 (entire mucosa seen well                            with no residual staining, small fragments of stool                            or opaque liquid). The total BBPS score equals 7.                            The quality of the bowel preparation was good. Scope In: 2:00:29 PM Scope Out: 2:17:54 PM Scope Withdrawal Time: 0 hours 12 minutes 56 seconds  Total Procedure Duration: 0 hours 17 minutes 25 seconds  Findings:      Non-bleeding internal hemorrhoids were found during endoscopy.      Multiple small-mouthed diverticula were found in the sigmoid colon.      An 8 mm polyp was found in the transverse colon. The polyp was       semi-pedunculated. The polyp was removed with a cold snare. Resection       and retrieval were complete.      The exam was otherwise without abnormality. Impression:               - Non-bleeding internal hemorrhoids.                           - Diverticulosis in the sigmoid colon.                           -  One 8 mm polyp in the transverse colon, removed                            with a cold snare. Resected and retrieved.                           - The examination was otherwise normal. Moderate Sedation:      Per Anesthesia Care Recommendation:           - Patient has a contact number available for                            emergencies. The signs and symptoms of potential                            delayed complications were discussed with the                            patient. Return to normal activities tomorrow.                            Written discharge instructions were provided to the                            patient.                            - Resume previous diet.                           - Continue present medications.                           - Await pathology results.                           - Repeat colonoscopy in 5 years for surveillance                            and family history of colon cancer in brother                           - Return to GI clinic PRN. Procedure Code(s):        --- Professional ---                           [phone], Colonoscopy, flexible; with removal of                            tumor(s), polyp(s), or other lesion(s) by snare                            technique Diagnosis Code(s):        --- Professional ---  Z80.0, Family history of malignant neoplasm of                            digestive organs                           K64.8, Other hemorrhoids                           D12.3, Benign neoplasm of transverse colon (hepatic                            flexure or splenic flexure)                           K57.30, Diverticulosis of large intestine without                            perforation or abscess without bleeding CPT copyright 2022 American Medical Association. All rights reserved. The codes documented in this report are preliminary and upon coder review may  be revised to meet current compliance requirements. Rolando Cliche. Mordechai April, DO Rolando Cliche. Mordechai April, DO 12/17/2023 2:29:35 PM This report has been signed electronically. Number of Addenda: 0

## 2023-12-17 NOTE — Transfer of Care (Signed)
 Immediate Anesthesia Transfer of Care Note  Patient: Tracy Freeman  Procedure(s) Performed: COLONOSCOPY  Patient Location: PACU and Endoscopy Unit  Anesthesia Type:General  Level of Consciousness: awake  Airway & Oxygen Therapy: Patient Spontanous Breathing  Post-op Assessment: Report given to RN  Post vital signs: Reviewed and stable  Last Vitals:  Vitals Value Taken Time  BP    Temp    Pulse    Resp    SpO2      Last Pain:  Vitals:   12/17/23 1357  TempSrc:   PainSc: 6       Patients Stated Pain Goal: 5 (12/17/23 1305)  Complications: No notable events documented.

## 2023-12-17 NOTE — H&P (Signed)
 Primary Care Physician:  Ander Bame, FNP Primary Gastroenterologist:  Dr. Mordechai April  Pre-Procedure History & Physical: HPI:  Tracy Freeman is a 58 y.o. female is here for a colonoscopy to be performed for high risk colon cancer screening purposes, family history of colon cancer in brother  Past Medical History:  Diagnosis Date   Anemia    Anxiety    Depression    Hypertension     Past Surgical History:  Procedure Laterality Date   CESAREAN SECTION  65 and 87-[facility] and [facility]   x2   excision of abdominal tumor     early 21's   INCISIONAL HERNIA REPAIR  03/07/2011   Procedure: HERNIA REPAIR INCISIONAL;  Surgeon: Beau Bound;  Location: [facility];  Service: General;  Laterality: N/A;  Incisional Herniorraphy with Mesh   LIPOMA EXCISION Left 12/05/2022   Procedure: EXCISION LIPOMA;  Surgeon: Thornell Flirt, DO;  Location: [facility];  Service: Plastics;  Laterality: Left;   SUPRACERVICAL ABDOMINAL HYSTERECTOMY  03/07/2011   Procedure: HYSTERECTOMY SUPRACERVICAL ABDOMINAL;  Surgeon: Wendelyn Halter, MD;  Location: [facility];  Service: Gynecology;  Laterality: N/A;   TUBAL LIGATION      Prior to Admission medications   Medication Sig Start Date End Date Taking? Authorizing Provider  atenolol (TENORMIN) 50 MG tablet Take 50 mg by mouth daily. 1/2 tablet once daily   Yes [provider]  ergocalciferol (VITAMIN D2) 1.25 MG (50000 UT) capsule Take 50,000 Units by mouth once a week.   Yes [provider]  FLUoxetine (PROZAC) 20 MG tablet Take 20 mg by mouth at bedtime. 07/23/23  Yes [provider]  HYDROcodone -acetaminophen  (NORCO/VICODIN) 5-325 MG tablet Take 1 tablet by mouth 4 (four) times daily. 10/18/23  Yes [provider]  naproxen (NAPROSYN) 500 MG tablet Take 500 mg by mouth 2 (two) times daily with a meal.   Yes [provider]  rosuvastatin (CRESTOR) 5 MG tablet Take 5 mg by mouth daily.   Yes [provider]   traZODone (DESYREL) 50 MG tablet Take 50 mg by mouth at bedtime. 10/23/23  Yes [provider]    Allergies as of 11/25/2023 - Review Complete 11/21/2023  Allergen Reaction Noted   Penicillins Other (See Comments) 02/23/2011   Red blood cells  12/05/2022    Family History  Problem Relation Age of Onset   Diabetes Mother    Hypertension Mother    Throat cancer Father    Anesthesia problems Neg Hx    Hypotension Neg Hx    Malignant hyperthermia Neg Hx    Pseudochol deficiency Neg Hx     Social History   Socioeconomic History   Marital status: Single    Spouse name: Not on file   Number of children: 2   Years of education: Not on file   Highest education level: High school graduate  Occupational History   Not on file  Tobacco Use   Smoking status: Every Day    Current packs/day: 0.25    Average packs/day: 0.3 packs/day for 10.0 years (2.5 ttl pk-yrs)    Types: Cigarettes   Smokeless tobacco: Not on file  Vaping Use   Vaping status: Some Days  Substance and Sexual Activity   Alcohol use: Yes    Alcohol/week: 6.0 standard drinks of alcohol    Types: 6 Cans of beer per week   Drug use: No   Sexual activity: Not Currently    Birth control/protection: Surgical  Other Topics Concern   Not on file  Social History Narrative   Not on file   Social Drivers of Health   Financial Resource Strain: Not on file  Food Insecurity: No Food Insecurity (06/01/2022)   Hunger Vital Sign    Worried About Running Out of Food in the Last Year: Never true    Ran Out of Food in the Last Year: Never true  Transportation Needs: No Transportation Needs (06/01/2022)   PRAPARE - Administrator, Civil Service (Medical): No    Lack of Transportation (Non-Medical): No  Physical Activity: Not on file  Stress: Not on file  Social Connections: Unknown (11/28/2021)   Received from [facility]   Social Network    Social Network: Not on file  Intimate  Partner Violence: Unknown (10/20/2021)   [facility] from [facility]   HITS    Physically Hurt: Not on file    Insult or Talk Down To: Not on file    Threaten Physical Harm: Not on file    Scream or Curse: Not on file    Review of Systems: See HPI, otherwise negative ROS  Physical Exam: Vital signs in last 24 hours: Temp:  [98.2 F (36.8 C)] 98.2 F (36.8 C) (06/03 1305) Pulse Rate:  [89] 89 (06/03 1305) Resp:  [16] 16 (06/03 1305) BP: (147)/(98) 147/98 (06/03 1305) SpO2:  [98 %] 98 % (06/03 1305) Weight:  [65.8 kg] 65.8 kg (06/03 1305)   General:   Alert,  Well-developed, well-nourished, pleasant and cooperative in NAD Head:  Normocephalic and atraumatic. Eyes:  Sclera clear, no icterus.   Conjunctiva pink. Ears:  Normal auditory acuity. Nose:  No deformity, discharge,  or lesions. Mouth:  No deformity or lesions, dentition normal. Neck:  Supple; no masses or thyromegaly. Lungs:  Clear throughout to auscultation.   No wheezes, crackles, or rhonchi. No acute distress. Heart:  Regular rate and rhythm; no murmurs, clicks, rubs,  or gallops. Abdomen:  Soft, nontender and nondistended. No masses, hepatosplenomegaly or hernias noted. Normal bowel sounds, without guarding, and without rebound.   Msk:  Symmetrical without gross deformities. Normal posture. Extremities:  Without clubbing or edema. Neurologic:  Alert and  oriented x4;  grossly normal neurologically. Skin:  Intact without significant lesions or rashes. Cervical Nodes:  No significant cervical adenopathy. Psych:  Alert and cooperative. Normal mood and affect.  Impression/Plan: Tracy Freeman is here for a colonoscopy to be performed for high risk colon cancer screening purposes, family history of colon cancer in brother  The risks of the procedure including infection, bleed, or perforation as well as benefits, limitations, alternatives and imponderables have been reviewed with the patient. Questions  have been answered. All parties agreeable.

## 2023-12-18 ENCOUNTER — Encounter (HOSPITAL_COMMUNITY): Payer: Self-pay | Admitting: Internal Medicine

## 2023-12-19 LAB — SURGICAL PATHOLOGY

## 2023-12-20 ENCOUNTER — Ambulatory Visit: Payer: Self-pay | Admitting: Internal Medicine

## 2023-12-20 NOTE — Anesthesia Postprocedure Evaluation (Signed)
 Anesthesia Post Note  Patient: Tracy Freeman  Procedure(s) Performed: COLONOSCOPY  Patient location during evaluation: Phase II Anesthesia Type: General Level of consciousness: awake Pain management: pain level controlled Vital Signs Assessment: post-procedure vital signs reviewed and stable Respiratory status: spontaneous breathing and respiratory function stable Cardiovascular status: blood pressure returned to baseline and stable Postop Assessment: no headache and no apparent nausea or vomiting Anesthetic complications: no Comments: Late entry   No notable events documented.   Last Vitals:  Vitals:   12/17/23 1305 12/17/23 1427  BP: (!) 147/98 (!) 128/91  Pulse: 89 (!) 107  Resp: 16 20  Temp: 36.8 C 36.8 C  SpO2: 98% 100%    Last Pain:  Vitals:   12/17/23 1427  TempSrc:   PainSc: 0-No pain                 Coretha Dew

## 2024-01-10 ENCOUNTER — Other Ambulatory Visit: Payer: Self-pay | Admitting: Internal Medicine

## 2024-01-10 DIAGNOSIS — Z1231 Encounter for screening mammogram for malignant neoplasm of breast: Secondary | ICD-10-CM

## 2024-01-14 ENCOUNTER — Ambulatory Visit
Admission: RE | Admit: 2024-01-14 | Discharge: 2024-01-14 | Disposition: A | Discharge status: Home / Self Care | Source: Ambulatory Visit | Attending: Internal Medicine | Admitting: Internal Medicine

## 2024-01-14 ENCOUNTER — Inpatient Hospital Stay: Admission: RE | Admit: 2024-01-14 | Source: Ambulatory Visit

## 2024-01-14 DIAGNOSIS — Z1231 Encounter for screening mammogram for malignant neoplasm of breast: Secondary | ICD-10-CM
